# Patient Record
Sex: Female | Born: 1955 | Race: Black or African American | Hispanic: No | Marital: Single | Smoking: Current every day smoker
Health system: Southern US, Community
[De-identification: ages and names within clinical notes are randomized; demographics above are authoritative.]

## PROBLEM LIST (undated history)

## (undated) DIAGNOSIS — I10 Essential (primary) hypertension: Secondary | ICD-10-CM

## (undated) DIAGNOSIS — M109 Gout, unspecified: Secondary | ICD-10-CM

## (undated) DIAGNOSIS — E039 Hypothyroidism, unspecified: Secondary | ICD-10-CM

## (undated) DIAGNOSIS — R7303 Prediabetes: Secondary | ICD-10-CM

## (undated) DIAGNOSIS — E785 Hyperlipidemia, unspecified: Secondary | ICD-10-CM

## (undated) DIAGNOSIS — E559 Vitamin D deficiency, unspecified: Secondary | ICD-10-CM

## (undated) DIAGNOSIS — R87810 Cervical high risk human papillomavirus (HPV) DNA test positive: Secondary | ICD-10-CM

## (undated) HISTORY — DX: Cervical high risk human papillomavirus (HPV) DNA test positive: R87.810

## (undated) HISTORY — PX: OTHER SURGICAL HISTORY: SHX169

## (undated) HISTORY — DX: Gout, unspecified: M10.9

---
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
## 2018-11-25 DIAGNOSIS — J111 Influenza due to unidentified influenza virus with other respiratory manifestations: Secondary | ICD-10-CM | POA: Diagnosis not present

## 2018-12-01 DIAGNOSIS — R0602 Shortness of breath: Secondary | ICD-10-CM | POA: Diagnosis not present

## 2019-01-12 DIAGNOSIS — H5213 Myopia, bilateral: Secondary | ICD-10-CM | POA: Diagnosis not present

## 2019-03-20 DIAGNOSIS — H52223 Regular astigmatism, bilateral: Secondary | ICD-10-CM | POA: Diagnosis not present

## 2019-03-20 DIAGNOSIS — H524 Presbyopia: Secondary | ICD-10-CM | POA: Diagnosis not present

## 2019-05-03 DIAGNOSIS — S8992XA Unspecified injury of left lower leg, initial encounter: Secondary | ICD-10-CM | POA: Diagnosis not present

## 2019-05-03 DIAGNOSIS — M25462 Effusion, left knee: Secondary | ICD-10-CM | POA: Diagnosis not present

## 2019-05-07 DIAGNOSIS — S83412A Sprain of medial collateral ligament of left knee, initial encounter: Secondary | ICD-10-CM | POA: Diagnosis not present

## 2019-05-20 ENCOUNTER — Other Ambulatory Visit (HOSPITAL_COMMUNITY): Payer: Self-pay | Admitting: Nurse Practitioner

## 2019-05-20 ENCOUNTER — Encounter: Payer: Self-pay | Admitting: Adult Health

## 2019-05-20 DIAGNOSIS — Z1231 Encounter for screening mammogram for malignant neoplasm of breast: Secondary | ICD-10-CM

## 2019-05-26 ENCOUNTER — Encounter (INDEPENDENT_AMBULATORY_CARE_PROVIDER_SITE_OTHER): Payer: Self-pay | Admitting: *Deleted

## 2019-05-29 DIAGNOSIS — M25562 Pain in left knee: Secondary | ICD-10-CM | POA: Diagnosis not present

## 2019-05-29 DIAGNOSIS — S83412A Sprain of medial collateral ligament of left knee, initial encounter: Secondary | ICD-10-CM | POA: Diagnosis not present

## 2019-06-04 DIAGNOSIS — I10 Essential (primary) hypertension: Secondary | ICD-10-CM | POA: Diagnosis not present

## 2019-06-05 DIAGNOSIS — M25462 Effusion, left knee: Secondary | ICD-10-CM | POA: Diagnosis not present

## 2019-06-05 DIAGNOSIS — M1712 Unilateral primary osteoarthritis, left knee: Secondary | ICD-10-CM | POA: Diagnosis not present

## 2019-06-05 DIAGNOSIS — M25562 Pain in left knee: Secondary | ICD-10-CM | POA: Diagnosis not present

## 2019-06-05 DIAGNOSIS — S83242A Other tear of medial meniscus, current injury, left knee, initial encounter: Secondary | ICD-10-CM | POA: Diagnosis not present

## 2019-06-05 DIAGNOSIS — S83412A Sprain of medial collateral ligament of left knee, initial encounter: Secondary | ICD-10-CM | POA: Diagnosis not present

## 2019-06-10 DIAGNOSIS — S83242A Other tear of medial meniscus, current injury, left knee, initial encounter: Secondary | ICD-10-CM | POA: Diagnosis not present

## 2019-06-10 DIAGNOSIS — S8002XD Contusion of left knee, subsequent encounter: Secondary | ICD-10-CM | POA: Diagnosis not present

## 2019-06-10 DIAGNOSIS — S83412A Sprain of medial collateral ligament of left knee, initial encounter: Secondary | ICD-10-CM | POA: Diagnosis not present

## 2019-06-17 ENCOUNTER — Ambulatory Visit (HOSPITAL_COMMUNITY)
Admission: RE | Admit: 2019-06-17 | Discharge: 2019-06-17 | Disposition: A | Discharge status: Home / Self Care | Payer: Medicaid Other | Source: Ambulatory Visit | Attending: Nurse Practitioner | Admitting: Nurse Practitioner

## 2019-06-17 ENCOUNTER — Other Ambulatory Visit: Payer: Self-pay

## 2019-06-17 DIAGNOSIS — M10071 Idiopathic gout, right ankle and foot: Secondary | ICD-10-CM | POA: Diagnosis not present

## 2019-06-17 DIAGNOSIS — M19071 Primary osteoarthritis, right ankle and foot: Secondary | ICD-10-CM | POA: Diagnosis not present

## 2019-06-17 DIAGNOSIS — Z1231 Encounter for screening mammogram for malignant neoplasm of breast: Secondary | ICD-10-CM | POA: Diagnosis not present

## 2019-06-30 ENCOUNTER — Telehealth (INDEPENDENT_AMBULATORY_CARE_PROVIDER_SITE_OTHER): Payer: Self-pay | Admitting: *Deleted

## 2019-06-30 NOTE — Telephone Encounter (Signed)
06/25/19 left message for patient to call to schedule TCS - PROFICIENT REFERRAL  Referring MD/PCP: Anastasio Champion   Procedure: tcs  Reason/Indication:  screening  Has patient had this procedure before?  no  If so, when, by whom and where?    Is there a family history of colon cancer?  no  Who?  What age when diagnosed?    Is patient diabetic?   no      Does patient have prosthetic heart valve or mechanical valve?  no  Do you have a pacemaker/defibrillator?  no  Has patient ever had endocarditis/atrial fibrillation? no  Does patient use oxygen? no  Has patient had joint replacement within last 12 months?  no  Is patient constipated or do they take laxatives? no  Does patient have a history of alcohol/drug use?  no  Is patient on blood thinner such as Coumadin, Plavix and/or Aspirin? no  Medications: nifedipine 90 mg daily, lisinopril 40 mg daily, levothyroxine 75 ,cg daily, atorvastatin 20 mg daily  Allergies: nkda  Pharmacy: [facility]  Medication Adjustment per Dr Charlena Cross, NP:   Procedure date & time:

## 2019-07-08 ENCOUNTER — Other Ambulatory Visit (INDEPENDENT_AMBULATORY_CARE_PROVIDER_SITE_OTHER): Payer: Self-pay | Admitting: *Deleted

## 2019-07-08 DIAGNOSIS — S83412D Sprain of medial collateral ligament of left knee, subsequent encounter: Secondary | ICD-10-CM | POA: Diagnosis not present

## 2019-07-08 DIAGNOSIS — S83222D Peripheral tear of medial meniscus, current injury, left knee, subsequent encounter: Secondary | ICD-10-CM | POA: Diagnosis not present

## 2019-07-08 DIAGNOSIS — Z1211 Encounter for screening for malignant neoplasm of colon: Secondary | ICD-10-CM

## 2019-07-12 ENCOUNTER — Other Ambulatory Visit (INDEPENDENT_AMBULATORY_CARE_PROVIDER_SITE_OTHER): Payer: Self-pay | Admitting: Nurse Practitioner

## 2019-07-27 ENCOUNTER — Ambulatory Visit (INDEPENDENT_AMBULATORY_CARE_PROVIDER_SITE_OTHER): Payer: Medicaid Other | Admitting: Internal Medicine

## 2019-07-27 ENCOUNTER — Other Ambulatory Visit: Payer: Self-pay

## 2019-07-27 ENCOUNTER — Encounter (INDEPENDENT_AMBULATORY_CARE_PROVIDER_SITE_OTHER): Payer: Self-pay | Admitting: Internal Medicine

## 2019-07-27 DIAGNOSIS — E039 Hypothyroidism, unspecified: Secondary | ICD-10-CM | POA: Diagnosis not present

## 2019-07-27 DIAGNOSIS — R7303 Prediabetes: Secondary | ICD-10-CM

## 2019-07-27 DIAGNOSIS — I10 Essential (primary) hypertension: Secondary | ICD-10-CM

## 2019-07-27 DIAGNOSIS — E785 Hyperlipidemia, unspecified: Secondary | ICD-10-CM | POA: Insufficient documentation

## 2019-07-27 DIAGNOSIS — E559 Vitamin D deficiency, unspecified: Secondary | ICD-10-CM | POA: Diagnosis not present

## 2019-07-27 DIAGNOSIS — E782 Mixed hyperlipidemia: Secondary | ICD-10-CM | POA: Diagnosis not present

## 2019-07-27 HISTORY — DX: Essential (primary) hypertension: I10

## 2019-07-27 HISTORY — DX: Hypothyroidism, unspecified: E03.9

## 2019-07-27 HISTORY — DX: Prediabetes: R73.03

## 2019-07-27 HISTORY — DX: Vitamin D deficiency, unspecified: E55.9

## 2019-07-27 HISTORY — DX: Hyperlipidemia, unspecified: E78.5

## 2019-07-27 NOTE — Progress Notes (Signed)
   Wellness Office Visit  Subjective:  Patient ID: Samantha Valenzuela, female    DOB: 1956/09/07  Age: 63 y.o. MRN: [id number]  CC: This lady comes in to follow-up regarding her hypertension, hyperlipidemia, hypothyroidism, prediabetes and vitamin D deficiency. HPI  When she was seen for the first time in July, she was not taking any of her chronic medications and blood work showed severely undertreated hypothyroidism, significant hyperlipidemia and vitamin D deficiency as well as prediabetes.  Since that time, she has been taking all her medications. She denies any chest pain, dyspnea or palpitations. She has not been taking any vitamin D3 supplementation and I do not think it was recommended as yet. Past Medical History:  Diagnosis Date  . Essential hypertension, benign 07/27/2019  . HLD (hyperlipidemia) 07/27/2019  . Hypothyroidism, adult 07/27/2019  . Prediabetes 07/27/2019  . Vitamin D deficiency disease 07/27/2019      History reviewed. No pertinent family history.  Social History   Social History Narrative   Single.6 kids live with her.Unemployed .     Current Meds  Medication Sig  . atorvastatin (LIPITOR) 20 MG tablet TAKE 1 TABLET BY MOUTH EVERY DAY IN THE EVENING  . levothyroxine (SYNTHROID) 75 MCG tablet TAKE 1 TABLET BY MOUTH EVERY DAY  . lisinopril (ZESTRIL) 40 MG tablet TAKE 1 TABLET BY MOUTH EVERY DAY  . NIFEdipine (ADALAT CC) 90 MG 24 hr tablet TAKE 1 TABLET BY MOUTH EVERY DAY       Objective:   Today's Vitals: BP (!) 160/80   Pulse 72   Ht 5\' 3"  (1.6 m)   Wt 136 lb 6.4 oz (61.9 kg)   BMI 24.16 kg/m  Vitals with BMI 07/27/2019  Height 5\' 3"   Weight 136 lbs 6 oz  BMI 123456  Systolic 0000000  Diastolic 80  Pulse 72     Physical Exam    She looks systemically well.  Systolic blood pressure slightly elevated again today.  We will need to continue to monitor this closely.   Assessment   1. Essential hypertension, benign   2. Hypothyroidism, adult    3. Mixed hyperlipidemia   4. Prediabetes   5. Vitamin D deficiency disease       Tests ordered Orders Placed This Encounter  Procedures  . COMPLETE METABOLIC PANEL WITH GFR  . T3, free  . T4  . TSH  . Lipid panel     Plan: 1. She will continue with all medications for her chronic conditions above. 2. Blood work is ordered as above. 3. I will have her follow-up with Judson Roch in about a month for close monitoring of her hypertension and I will let her know of her blood results in the meantime.     Doree Albee, MD

## 2019-07-27 NOTE — Patient Instructions (Signed)
TAKE VITAMIN D3 10,000 units/day

## 2019-07-28 ENCOUNTER — Encounter (INDEPENDENT_AMBULATORY_CARE_PROVIDER_SITE_OTHER): Payer: Self-pay | Admitting: Internal Medicine

## 2019-07-28 ENCOUNTER — Other Ambulatory Visit (INDEPENDENT_AMBULATORY_CARE_PROVIDER_SITE_OTHER): Payer: Self-pay | Admitting: Internal Medicine

## 2019-07-28 LAB — COMPLETE METABOLIC PANEL WITH GFR
AG Ratio: 1.5 (calc) (ref 1.0–2.5)
ALT: 11 U/L (ref 6–29)
AST: 17 U/L (ref 10–35)
Albumin: 4.8 g/dL (ref 3.6–5.1)
Alkaline phosphatase (APISO): 108 U/L (ref 37–153)
BUN: 14 mg/dL (ref 7–25)
CO2: 27 mmol/L (ref 20–32)
Calcium: 10.1 mg/dL (ref 8.6–10.4)
Chloride: 102 mmol/L (ref 98–110)
Creat: 0.7 mg/dL (ref 0.50–0.99)
GFR, Est African American: 107 mL/min/{1.73_m2} (ref >=60)
GFR, Est Non African American: 92 mL/min/{1.73_m2} (ref >=60)
Globulin: 3.3 g/dL (calc) (ref 1.9–3.7)
Glucose, Bld: 92 mg/dL (ref 65–99)
Potassium: 4.4 mmol/L (ref 3.5–5.3)
Sodium: 138 mmol/L (ref 135–146)
Total Bilirubin: 0.4 mg/dL (ref 0.2–1.2)
Total Protein: 8.1 g/dL (ref 6.1–8.1)

## 2019-07-28 LAB — LIPID PANEL
Cholesterol: 238 mg/dL — ABNORMAL HIGH (ref <200)
HDL: 48 mg/dL — ABNORMAL LOW (ref >=50)
LDL Cholesterol (Calc): 164 mg/dL (calc) — ABNORMAL HIGH
Non-HDL Cholesterol (Calc): 190 mg/dL (calc) — ABNORMAL HIGH (ref <130)
Total CHOL/HDL Ratio: 5 (calc) — ABNORMAL HIGH (ref <5.0)
Triglycerides: 128 mg/dL (ref <150)

## 2019-07-28 LAB — T4: T4, Total: 7.2 ug/dL (ref 5.1–11.9)

## 2019-07-28 LAB — T3, FREE: T3, Free: 2.4 pg/mL (ref 2.3–4.2)

## 2019-07-28 LAB — TSH: TSH: 14.24 mIU/L — ABNORMAL HIGH (ref 0.40–4.50)

## 2019-07-28 MED ORDER — LEVOTHYROXINE SODIUM 100 MCG PO TABS: 100.0000 ug | ORAL_TABLET | Freq: Every day | ORAL | 3 refills | Status: DC

## 2019-07-28 NOTE — Progress Notes (Signed)
Patient called. Give verbal instruction on STOP current dose of thyroid medication. To START new RX levothyroxine 100 mcg; 1 daily. New Rx is at her pharmacy to pick today.

## 2019-07-30 DIAGNOSIS — K625 Hemorrhage of anus and rectum: Secondary | ICD-10-CM | POA: Diagnosis not present

## 2019-07-30 DIAGNOSIS — R109 Unspecified abdominal pain: Secondary | ICD-10-CM | POA: Diagnosis not present

## 2019-07-30 DIAGNOSIS — D259 Leiomyoma of uterus, unspecified: Secondary | ICD-10-CM | POA: Diagnosis not present

## 2019-07-30 DIAGNOSIS — K529 Noninfective gastroenteritis and colitis, unspecified: Secondary | ICD-10-CM | POA: Diagnosis not present

## 2019-07-30 DIAGNOSIS — R1032 Left lower quadrant pain: Secondary | ICD-10-CM | POA: Diagnosis not present

## 2019-07-30 DIAGNOSIS — R1031 Right lower quadrant pain: Secondary | ICD-10-CM | POA: Diagnosis not present

## 2019-08-01 DIAGNOSIS — E039 Hypothyroidism, unspecified: Secondary | ICD-10-CM | POA: Diagnosis not present

## 2019-08-01 DIAGNOSIS — I10 Essential (primary) hypertension: Secondary | ICD-10-CM | POA: Diagnosis not present

## 2019-08-01 DIAGNOSIS — K529 Noninfective gastroenteritis and colitis, unspecified: Secondary | ICD-10-CM | POA: Diagnosis not present

## 2019-08-02 DIAGNOSIS — A09 Infectious gastroenteritis and colitis, unspecified: Secondary | ICD-10-CM | POA: Diagnosis not present

## 2019-08-07 ENCOUNTER — Telehealth: Payer: Self-pay | Admitting: Adult Health

## 2019-08-07 NOTE — Telephone Encounter (Signed)

## 2019-08-10 ENCOUNTER — Other Ambulatory Visit (HOSPITAL_COMMUNITY)
Admission: RE | Admit: 2019-08-10 | Discharge: 2019-08-10 | Disposition: A | Discharge status: Home / Self Care | Payer: Medicaid Other | Source: Ambulatory Visit | Attending: Adult Health | Admitting: Adult Health

## 2019-08-10 ENCOUNTER — Other Ambulatory Visit: Payer: Self-pay

## 2019-08-10 ENCOUNTER — Ambulatory Visit: Payer: Medicaid Other | Admitting: Adult Health

## 2019-08-10 ENCOUNTER — Encounter: Payer: Self-pay | Admitting: Adult Health

## 2019-08-10 VITALS — BP 112/74 | HR 78 | Ht 65.0 in | Wt 135.8 lb

## 2019-08-10 DIAGNOSIS — Z01419 Encounter for gynecological examination (general) (routine) without abnormal findings: Secondary | ICD-10-CM | POA: Diagnosis not present

## 2019-08-10 DIAGNOSIS — Z124 Encounter for screening for malignant neoplasm of cervix: Secondary | ICD-10-CM | POA: Insufficient documentation

## 2019-08-10 DIAGNOSIS — Z1212 Encounter for screening for malignant neoplasm of rectum: Secondary | ICD-10-CM | POA: Diagnosis not present

## 2019-08-10 DIAGNOSIS — Z1211 Encounter for screening for malignant neoplasm of colon: Secondary | ICD-10-CM

## 2019-08-10 DIAGNOSIS — B369 Superficial mycosis, unspecified: Secondary | ICD-10-CM | POA: Diagnosis not present

## 2019-08-10 LAB — HEMOCCULT GUIAC POC 1CARD (OFFICE): Fecal Occult Blood, POC: NEGATIVE

## 2019-08-10 MED ORDER — NYSTATIN-TRIAMCINOLONE 100000-0.1 UNIT/GM-% EX OINT: 1.0000 "application " | TOPICAL_OINTMENT | Freq: Two times a day (BID) | CUTANEOUS | 3 refills | Status: DC

## 2019-08-10 NOTE — Progress Notes (Signed)
Patient ID: Samantha Valenzuela, female   DOB: 14-Nov-1955, 63 y.o.   MRN: [id number] History of Present Illness: Samantha Valenzuela is a  63 year old black female, single, PM in for a pap smear and pelvic exam, she is a new pt. PCP is Jeralyn Ruths NP.   Current Medications, Allergies, Past Medical History, Past Surgical History, Family History and Social History were reviewed in [employer] record.     Review of Systems: Patient denies any headaches, hearing loss, fatigue, blurred vision, shortness of breath, chest pain, abdominal pain, problems with bowel movements, urination, or intercourse(not active). No joint pain or mood swings. Has itching in groin area. She was in hospital this year with bowel infection at [facility].    Physical Exam:BP 112/74 (BP Location: Right Arm, Patient Position: Sitting, Cuff Size: Normal)   Pulse 78   Ht 5\' 5"  (1.651 m)   Wt 135 lb 12.8 oz (61.6 kg)   BMI 22.60 kg/m  General:  Well developed, well nourished, no acute distress Skin:  Warm and dry Neck:  Midline trachea, normal thyroid, good ROM, no lymphadenopathy,no carotid bruits heard  Lungs; Clear to auscultation bilaterally Cardiovascular: Regular rate and rhythm Pelvic:  External genitalia is normal in appearance, does have some discoloration in creases of legs.  The vagina is normal in appearance. Urethra has no lesions or masses. The cervix is smooth, slightly deviated to the right, pap with GC/CHL and high risk HPV testing and 16/18 genotyping performed.  Uterus is felt to be normal size, shape, and contour.  No adnexal masses or tenderness noted.Bladder is non tender, no masses felt. Rectal: Good sphincter tone, no polyps, or hemorrhoids felt.  Hemoccult negative. Extremities/musculoskeletal:  No swelling or varicosities noted, no clubbing or cyanosis Psych:  No mood changes, alert and cooperative,seems happy Fall risk is low PHQ 2 score 0 Examination chaperoned by Rolena Infante  LPN  Impression and Plan: 1. Routine cervical smear   2. Encounter for gynecological examination with Papanicolaou smear of cervix -pap sent -physical with PCP -pap in 3 years if normal -mammogram yearly -colonoscopy per GI -try to decrease smoking  3. Encounter for colorectal cancer screening  4. Superficial fungus infection of skin -will rx mycolog Meds ordered this encounter  Medications  . nystatin-triamcinolone ointment (MYCOLOG)    Sig: Apply 1 application topically 2 (two) times daily.    Dispense:  30 g    Refill:  3    Order Specific Question:   Supervising Provider    Answer:   Tania Ade H [[id number]]

## 2019-08-17 ENCOUNTER — Telehealth: Payer: Self-pay | Admitting: Adult Health

## 2019-08-17 NOTE — Telephone Encounter (Signed)
[facility] and left message to separate mycolog to nystatin and triamcinolone same directions.

## 2019-08-25 DIAGNOSIS — Z139 Encounter for screening, unspecified: Secondary | ICD-10-CM | POA: Insufficient documentation

## 2019-08-25 NOTE — Progress Notes (Signed)
Subjective:  Patient ID: Samantha Valenzuela, female    DOB: 11-Aug-1956  Age: 63 y.o. MRN: [id number]  CC:  Chief Complaint  Patient presents with  . Gout  . Hypertension  . Hyperlipidemia  . Hypothyroidism  . other    vitamin d deficiency  . other    Preventative medicine      HPI   This patient presents today for a follow-up office visit.  Left toe pain: This patient presents today complaining of left toe pain that started a couple of nights ago.  She tells me she recently had a gout attack a few weeks ago and tells me her symptoms are quite similar to her last gout attack.  She is wondering what she can do to treat this.  Screenings/health maintenance: She was a new patient to this practice approximately 3 to 4 months ago.  At that time she was ordered multiple tests and screenings.  She did complete mammogram in August of this year and is recommended to rescreen in 1 year.  She also was referred to OB/GYN to undergo Pap smear for cervical cancer screening.  This was collected on August 10, 2019.  Per patient's OB/GYN if cytology returned normal she will be recommended to rescreen in 3 years.  I had also ordered ultrasound of upper left chest wall for evaluation of a mass, this was not completed.  She tells me the mass has not changed in any way since I last saw her.  I also recommended that she undergo colon cancer screening via colonoscopy.  She tells me she is scheduled to have colonoscopy completed in December of this year.  She also has a history of hypertension.  She is currently on lisinopril 40 mg and nifedipine 90 mg.  She does check her blood pressures at home.  She tells me they have been very labile, but she has been having headaches and some lightheadedness at times.  She does note that her blood pressures are more often high at home been low.  At our last office visit her blood pressure was approximately 160/80.  She does report taking her medications as prescribed but  does need refills.  She also has a history of hyperlipidemia and continues on atorvastatin 20 mg daily.  Last lipid panel was collected in September 2020 and showed total cholesterol 238, HDL 48, LDL 164, and triglycerides 128.  Last comprehensive metabolic panel was collected in September 2020 and showed normal AST and ALT.  She also has a history of hypothyroidism.  She continues on levothyroxine 100 mcg daily.  Last lipid panel showed TSH of 14.25.  After this level was collected her dose was increased to the 100 mcg daily.  She tells me she is tolerating this well.  She tells me her energy levels are pretty stable, and she actually does not feel very fatigued.  Vitamin D deficiency:  Last serum level was 14.  She tells me she is currently taking 10,000 international units of vitamin D3 daily.  Past Medical History:  Diagnosis Date  . Essential hypertension, benign 07/27/2019  . HLD (hyperlipidemia) 07/27/2019  . Hypothyroidism, adult 07/27/2019  . Prediabetes 07/27/2019  . Vitamin D deficiency disease 07/27/2019      Family History  Problem Relation Age of Onset  . Dementia Father   . Hypertension Father   . Kidney disease Mother   . Cancer Daughter        ovarian  . Lupus Daughter  Social History   Social History Narrative   Single.6 kids live with her.Unemployed .   Social History   Tobacco Use  . Smoking status: Current Every Day Smoker    Packs/day: 0.50    Years: 43.00    Pack years: 21.50    Types: Cigarettes  . Smokeless tobacco: Never Used  Substance Use Topics  . Alcohol use: Yes    Comment: occassionally     Current Meds  Medication Sig  . cholecalciferol (VITAMIN D3) 25 MCG (1000 UT) tablet Take 10,000 Units by mouth daily.  Marland Kitchen levothyroxine (SYNTHROID) 100 MCG tablet Take 1 tablet (100 mcg total) by mouth daily.  Marland Kitchen lisinopril (ZESTRIL) 40 MG tablet Take 1 tablet (40 mg total) by mouth daily.  Marland Kitchen NIFEdipine (ADALAT CC) 90 MG 24 hr tablet Take 1 tablet  (90 mg total) by mouth daily.  . [DISCONTINUED] atorvastatin (LIPITOR) 20 MG tablet TAKE 1 TABLET BY MOUTH EVERY DAY IN THE EVENING  . [DISCONTINUED] lisinopril (ZESTRIL) 40 MG tablet TAKE 1 TABLET BY MOUTH EVERY DAY  . [DISCONTINUED] NIFEdipine (ADALAT CC) 90 MG 24 hr tablet TAKE 1 TABLET BY MOUTH EVERY DAY (Patient taking differently: 90 mg. )    ROS:  Review of Systems  Constitutional: Negative for chills, fever and malaise/fatigue.  Eyes: Negative for blurred vision and double vision.  Respiratory: Negative for cough and shortness of breath.   Cardiovascular: Negative for chest pain and palpitations.  Neurological: Positive for headaches. Negative for dizziness.     Objective:   Today's Vitals: BP (!) 138/92   Pulse 89   Resp 12   Ht 5' 4.5" (1.638 m)   Wt 131 lb 12.8 oz (59.8 kg)   SpO2 96%   BMI 22.27 kg/m  Vitals with BMI 08/26/2019 08/10/2019 07/27/2019  Height 5' 4.5" 5\' 5"  5\' 3"   Weight 131 lbs 13 oz 135 lbs 13 oz 136 lbs 6 oz  BMI 22.28 [id number] 123456  Systolic 0000000 [id number] 0000000  Diastolic 92 74 80  Pulse 89 78 72     Physical Exam Vitals signs reviewed.  Constitutional:      General: She is not in acute distress.    Appearance: Normal appearance.  HENT:     Head: Normocephalic and atraumatic.  Neck:     Vascular: No carotid bruit.  Cardiovascular:     Rate and Rhythm: Normal rate and regular rhythm.     Pulses: Normal pulses.     Heart sounds: Normal heart sounds.  Pulmonary:     Effort: Pulmonary effort is normal.     Breath sounds: Normal breath sounds.  Chest:    Musculoskeletal:       Feet:  Feet:     Left foot:     Skin integrity: Erythema and warmth present.  Skin:    General: Skin is warm and dry.  Neurological:     General: No focal deficit present.     Mental Status: She is alert and oriented to person, place, and time.  Psychiatric:        Mood and Affect: Mood normal.        Behavior: Behavior normal.        Judgment: Judgment normal.           Assessment   1. Essential hypertension, benign   2. Encounter for screening   3. Hyperlipidemia, unspecified hyperlipidemia type   4. Hypothyroidism, adult   5. Vitamin D deficiency disease   6. Acute gout  involving toe of left foot, unspecified cause   7. Chest wall mass       Tests ordered Orders Placed This Encounter  Procedures  . [place] CHEST SOFT TISSUE  . Uric acid  . TSH  . T3, Free  . T4, Free  . Vitamin D, 25-hydroxy     Plan: Please see assessment and plan per problem list below.   Meds ordered this encounter  Medications  . colchicine 0.6 MG tablet    Sig: Take 2 tablets by mouth once. Take 1 tablet by mouth 1 hour later if you continue to have pain.    Dispense:  3 tablet    Refill:  1    Order Specific Question:   Supervising Provider    Answer:   Anastasio Champion, NIMISH C [id number]  . indomethacin (INDOCIN) 25 MG capsule    Sig: Take 1 capsule (25 mg total) by mouth 3 (three) times daily as needed.    Dispense:  30 capsule    Refill:  0    Order Specific Question:   Supervising Provider    Answer:   Hurshel Party C [id number]  . lisinopril (ZESTRIL) 40 MG tablet    Sig: Take 1 tablet (40 mg total) by mouth daily.    Dispense:  90 tablet    Refill:  1    Order Specific Question:   Supervising Provider    Answer:   Hurshel Party C [id number]  . NIFEdipine (ADALAT CC) 90 MG 24 hr tablet    Sig: Take 1 tablet (90 mg total) by mouth daily.    Dispense:  90 tablet    Refill:  1    Order Specific Question:   Supervising Provider    Answer:   Hurshel Party C [id number]  . atorvastatin (LIPITOR) 20 MG tablet    Sig: Take 1 tablet (20 mg total) by mouth every evening.    Dispense:  90 tablet    Refill:  1    Order Specific Question:   Supervising Provider    Answer:   Hurshel Party C [id number]  . metoprolol tartrate (LOPRESSOR) 25 MG tablet    Sig: Take 0.5 tablets (12.5 mg total) by mouth 2 (two) times daily.    Dispense:  180 tablet    Refill:  1     Order Specific Question:   Supervising Provider    Answer:   Doree Albee [id number]    Patient to follow-up in 2 months, but was encouraged to call the office with any questions or concerns prior to then.Marland Kitchen  Ailene Ards, NP

## 2019-08-26 ENCOUNTER — Telehealth (INDEPENDENT_AMBULATORY_CARE_PROVIDER_SITE_OTHER): Payer: Self-pay

## 2019-08-26 ENCOUNTER — Ambulatory Visit (INDEPENDENT_AMBULATORY_CARE_PROVIDER_SITE_OTHER): Payer: Medicaid Other | Admitting: Nurse Practitioner

## 2019-08-26 ENCOUNTER — Encounter (INDEPENDENT_AMBULATORY_CARE_PROVIDER_SITE_OTHER): Payer: Self-pay | Admitting: Nurse Practitioner

## 2019-08-26 ENCOUNTER — Other Ambulatory Visit: Payer: Self-pay

## 2019-08-26 VITALS — BP 138/92 | HR 89 | Resp 12 | Ht 64.5 in | Wt 131.8 lb

## 2019-08-26 DIAGNOSIS — R222 Localized swelling, mass and lump, trunk: Secondary | ICD-10-CM | POA: Diagnosis not present

## 2019-08-26 DIAGNOSIS — E039 Hypothyroidism, unspecified: Secondary | ICD-10-CM

## 2019-08-26 DIAGNOSIS — I10 Essential (primary) hypertension: Secondary | ICD-10-CM | POA: Diagnosis not present

## 2019-08-26 DIAGNOSIS — E785 Hyperlipidemia, unspecified: Secondary | ICD-10-CM | POA: Diagnosis not present

## 2019-08-26 DIAGNOSIS — M109 Gout, unspecified: Secondary | ICD-10-CM | POA: Diagnosis not present

## 2019-08-26 DIAGNOSIS — Z139 Encounter for screening, unspecified: Secondary | ICD-10-CM

## 2019-08-26 DIAGNOSIS — E559 Vitamin D deficiency, unspecified: Secondary | ICD-10-CM

## 2019-08-26 LAB — CYTOLOGY - PAP
Chlamydia: NEGATIVE
Comment: NEGATIVE
Comment: NEGATIVE
Comment: NEGATIVE
Comment: NORMAL
Diagnosis: NEGATIVE
HPV 16: NEGATIVE
HPV 18 / 45: POSITIVE — AB
High risk HPV: POSITIVE — AB
Neisseria Gonorrhea: NEGATIVE

## 2019-08-26 MED ORDER — ATORVASTATIN CALCIUM 20 MG PO TABS: 20.0000 mg | ORAL_TABLET | Freq: Every evening | ORAL | 1 refills | Status: DC

## 2019-08-26 MED ORDER — LISINOPRIL 40 MG PO TABS: 40.0000 mg | ORAL_TABLET | Freq: Every day | ORAL | 1 refills | Status: DC

## 2019-08-26 MED ORDER — INDOMETHACIN 25 MG PO CAPS: 25.0000 mg | ORAL_CAPSULE | Freq: Three times a day (TID) | ORAL | 0 refills | Status: DC | PRN

## 2019-08-26 MED ORDER — METOPROLOL TARTRATE 25 MG PO TABS: 12.5000 mg | ORAL_TABLET | Freq: Two times a day (BID) | ORAL | 1 refills | Status: DC

## 2019-08-26 MED ORDER — COLCHICINE 0.6 MG PO TABS: ORAL_TABLET | ORAL | 1 refills | Status: DC

## 2019-08-26 MED ORDER — NIFEDIPINE ER 90 MG PO TB24: 90.0000 mg | ORAL_TABLET | Freq: Every day | ORAL | 1 refills | Status: DC

## 2019-08-26 NOTE — Assessment & Plan Note (Signed)
She will continue on her current dose of her statin.  I did tell her that colchicine may interact with her statin and she may have some muscle pain, but if she has muscle pain that lasts longer than 2 days after taking the colchicine she needs to call this office.  She tells me she understands.  May need to consider increasing her dose of statin at future office visit.  I opted not to make a change to this medication today as she was prescribed multiple new medications today, but this may be something that needs to be done in the future.  Consider collecting lipid panel at next office visit.

## 2019-08-26 NOTE — Patient Instructions (Signed)
Thank you for choosing [facility] as your medical provider! If you have any questions or concerns regarding your health care, please do not hesitate to call our office.  Things that we talked about today: 1.  Gout: I have prescribed you colchicine to take today.  Take 2 tablets at one time, wait 1 hour and if you continue to see redness swelling and have pain take 1 more tablet.  I have also prescribed indomethacin that you can take 1 to 2 capsules as needed every 8 hours with food for pain.  Take this medicine for the shortest amount of time that she can tolerate it. 2.  Hypertension: Blood pressure is still slightly above goal so we added metoprolol 12.5 mg that you take by mouth twice a day.  Continue to monitor blood pressure closely at home.  If you see blood pressure less than 100/70 and you are feeling unwell (tired, short of breath, chest pain, etc.)  You may stop the medication, but call this office prior to stopping it. 3.  Hypothyroidism: I will check her blood work today.  Based on those results I may send a new higher dose of your thyroid medication, if not I will refill the current dose that you are on once I get the results back. 4.  Vitamin D deficiency: Continue on your vitamin D supplement.  I will check your blood level today to ensure that it is improved. 5.  Chest wall mass: I am going to reorder the ultrasound for this area.  If you do not hear from someone to schedule this within 2 weeks please call us to let us know.  Please follow-up as scheduled in 2 months. We look forward to seeing you again soon! Have a great Halloween!!  At [facility] we value your feedback. You may receive a survey about your visit today. Please share your experience as we strive to create trusting relationships with our patients to provide genuine, compassionate, quality care.  We appreciate your understanding and patience as we review any laboratory studies, imaging, and other  diagnostic tests that are ordered as we care for you. We do our best to address any and all results in a timely manner. If you do not hear about test results within 1 week, please do not hesitate to contact us. If we referred you to a specialist during your visit or ordered imaging testing, contact the office if you have not been contacted to be scheduled within 1 weeks.  We also encourage the use of MyChart, which contains your medical information for your review as well. If you are not enrolled in this feature, an access code is on this after visit summary for your convenience. Thank you for allowing [place] to be involved in your care.

## 2019-08-26 NOTE — Assessment & Plan Note (Signed)
I will collect thyroid panel today for further evaluation of her hypothyroidism.  She seems to be tolerating her current dose of levothyroxine well and feels well on it.  She does need a refill of this medication, and I will refill it and or change the dose based on her blood results.

## 2019-08-26 NOTE — Assessment & Plan Note (Signed)
I will add metoprolol 12.5 mg twice daily to help control her blood pressure.  I did discuss common side effects of this medication and that if she feels unwell while taking this to call this office and we may need to discontinue it.  May need to consider changing it to long-acting metoprolol so she only needs to take 1 tablet/day, but both myself and the patient decided to try the short acting first to see how she tolerates the medication.  She was encouraged to monitor her blood pressure closely at home and I recommended that she call this office if she notices her blood pressure running less than 100/70 and if she starts to experience negative side effects such as (lightheadedness, dizziness, fatigue).

## 2019-08-26 NOTE — Assessment & Plan Note (Signed)
She was encouraged to follow-up with gastroenterology for colonoscopy as scheduled.  She tells me she had her flu shot recently while she was hospitalized for colitis.  Mammogram was negative so she will be due to rescreen in 1 year.   If her Pap smear results come back normal she will be due for that again in 3 years.

## 2019-08-26 NOTE — Assessment & Plan Note (Signed)
I will collect serum vitamin D level today to see if this has improved since starting her vitamin D supplement.  Further recommendations may be made based on these results.

## 2019-08-27 ENCOUNTER — Telehealth (INDEPENDENT_AMBULATORY_CARE_PROVIDER_SITE_OTHER): Payer: Self-pay | Admitting: Nurse Practitioner

## 2019-08-27 LAB — TSH: TSH: 2.13 mIU/L (ref 0.40–4.50)

## 2019-08-27 LAB — T3, FREE: T3, Free: 3.1 pg/mL (ref 2.3–4.2)

## 2019-08-27 LAB — VITAMIN D 25 HYDROXY (VIT D DEFICIENCY, FRACTURES): Vit D, 25-Hydroxy: 44 ng/mL (ref 30–100)

## 2019-08-27 LAB — URIC ACID: Uric Acid, Serum: 4.2 mg/dL (ref 2.5–7.0)

## 2019-08-27 LAB — T4, FREE: Free T4: 1.3 ng/dL (ref 0.8–1.8)

## 2019-08-27 NOTE — Telephone Encounter (Signed)
I called this patient today to go over her blood work results.  I also asked her how her toe is doing.  She tells me her pain is much improved as compared to yesterday.  I did note that her uric acid level was normal, thus not sure if the pain was coming from a gout attack.  However her pain is better.  I did encourage her to call this office if the pain comes back.  She tells me she understands.  She will follow-up as scheduled.

## 2019-08-31 ENCOUNTER — Telehealth: Payer: Self-pay | Admitting: Adult Health

## 2019-08-31 NOTE — Telephone Encounter (Signed)
Pt aware that pap was negative for GC/CHL and malignancy but +HPV 18 will make colpo app with Dr Elonda Husky

## 2019-09-01 ENCOUNTER — Other Ambulatory Visit (INDEPENDENT_AMBULATORY_CARE_PROVIDER_SITE_OTHER): Payer: Self-pay | Admitting: *Deleted

## 2019-09-01 ENCOUNTER — Other Ambulatory Visit: Payer: Self-pay

## 2019-09-01 ENCOUNTER — Telehealth (INDEPENDENT_AMBULATORY_CARE_PROVIDER_SITE_OTHER): Payer: Self-pay | Admitting: *Deleted

## 2019-09-01 ENCOUNTER — Ambulatory Visit (HOSPITAL_COMMUNITY)
Admission: RE | Admit: 2019-09-01 | Discharge: 2019-09-01 | Disposition: A | Discharge status: Home / Self Care | Payer: Medicaid Other | Source: Ambulatory Visit | Attending: Nurse Practitioner | Admitting: Nurse Practitioner

## 2019-09-01 ENCOUNTER — Encounter (INDEPENDENT_AMBULATORY_CARE_PROVIDER_SITE_OTHER): Payer: Self-pay | Admitting: *Deleted

## 2019-09-01 DIAGNOSIS — D170 Benign lipomatous neoplasm of skin and subcutaneous tissue of head, face and neck: Secondary | ICD-10-CM | POA: Diagnosis not present

## 2019-09-01 DIAGNOSIS — R222 Localized swelling, mass and lump, trunk: Secondary | ICD-10-CM

## 2019-09-01 DIAGNOSIS — R221 Localized swelling, mass and lump, neck: Secondary | ICD-10-CM | POA: Diagnosis not present

## 2019-09-01 DIAGNOSIS — Z1211 Encounter for screening for malignant neoplasm of colon: Secondary | ICD-10-CM

## 2019-09-01 MED ORDER — SUPREP BOWEL PREP KIT 17.5-3.13-1.6 GM/177ML PO SOLN: 1.0000 | Freq: Once | ORAL | 0 refills | Status: AC

## 2019-09-01 NOTE — Telephone Encounter (Signed)
Patient needs suprep TCS sch'd 12/17

## 2019-09-03 NOTE — Telephone Encounter (Signed)
Will need auth possible; NPC will get auth #.

## 2019-09-04 ENCOUNTER — Telehealth: Payer: Self-pay | Admitting: Obstetrics & Gynecology

## 2019-09-04 NOTE — Telephone Encounter (Signed)

## 2019-09-07 ENCOUNTER — Encounter: Payer: Self-pay | Admitting: Obstetrics & Gynecology

## 2019-09-07 ENCOUNTER — Ambulatory Visit: Payer: Medicaid Other | Admitting: Obstetrics & Gynecology

## 2019-09-07 ENCOUNTER — Other Ambulatory Visit: Payer: Self-pay

## 2019-09-07 VITALS — BP 111/73 | HR 55 | Ht 65.0 in | Wt 133.0 lb

## 2019-09-07 DIAGNOSIS — R87810 Cervical high risk human papillomavirus (HPV) DNA test positive: Secondary | ICD-10-CM | POA: Diagnosis not present

## 2019-09-07 NOTE — Progress Notes (Signed)
Colposcopy Procedure Note:  Colposcopy Procedure Note  Indications:  10/20 Pap smear. Negative cytology, positive HPV 18 No other Pap for 10 years  Smoker:  Yes.   New sexual partner:  No.    History of abnormal Pap: no  Procedure Details  The risks and benefits of the procedure and Written informed consent obtained.  Speculum placed in vagina and excellent visualization of cervix achieved, cervix swabbed x 3 with acetic acid solution.  Findings: Cervix: no visible lesions, no mosaicism, no punctation and no abnormal vasculature; SCJ visualized 360 degrees without lesions and no biopsies taken. Vaginal inspection: vaginal colposcopy not performed. Vulvar colposcopy: vulvar colposcopy not performed.  Specimens: none  Complications: none.  Plan: Repeat HPV based testing in 1 year

## 2019-09-12 ENCOUNTER — Other Ambulatory Visit (INDEPENDENT_AMBULATORY_CARE_PROVIDER_SITE_OTHER): Payer: Self-pay | Admitting: Internal Medicine

## 2019-09-16 DIAGNOSIS — S83412D Sprain of medial collateral ligament of left knee, subsequent encounter: Secondary | ICD-10-CM | POA: Diagnosis not present

## 2019-09-17 ENCOUNTER — Telehealth (INDEPENDENT_AMBULATORY_CARE_PROVIDER_SITE_OTHER): Payer: Self-pay | Admitting: *Deleted

## 2019-09-17 ENCOUNTER — Other Ambulatory Visit: Payer: Self-pay

## 2019-09-17 ENCOUNTER — Ambulatory Visit (INDEPENDENT_AMBULATORY_CARE_PROVIDER_SITE_OTHER): Payer: Self-pay

## 2019-09-17 NOTE — Telephone Encounter (Signed)
Referring MD/PCP: gosrani   Procedure: tcs  Reason/Indication:  screening  Has patient had this procedure before?  no             If so, when, by whom and where?    Is there a family history of colon cancer?  no             Who?  What age when diagnosed?    Is patient diabetic?   no                                                  Does patient have prosthetic heart valve or mechanical valve?  no  Do you have a pacemaker/defibrillator?  no  Has patient ever had endocarditis/atrial fibrillation? no  Does patient use oxygen? no  Has patient had joint replacement within last 12 months?  no  Is patient constipated or do they take laxatives? no  Does patient have a history of alcohol/drug use?  no  Is patient on blood thinner such as Coumadin, Plavix and/or Aspirin? no  Medications: nifedipine 90 mg daily, lisinopril 40 mg daily, levothyroxine 75 ,cg daily, atorvastatin 20 mg daily  Allergies: nkda  [facility]  Medication Adjustment per Dr Laural Golden:    Procedure date & time: 10/15/19 at 830

## 2019-10-05 NOTE — Telephone Encounter (Signed)
Colonoscopy with conscious sedation 

## 2019-10-07 ENCOUNTER — Ambulatory Visit (INDEPENDENT_AMBULATORY_CARE_PROVIDER_SITE_OTHER): Payer: Medicaid Other | Admitting: Nurse Practitioner

## 2019-10-07 ENCOUNTER — Encounter (INDEPENDENT_AMBULATORY_CARE_PROVIDER_SITE_OTHER): Payer: Self-pay | Admitting: Nurse Practitioner

## 2019-10-07 ENCOUNTER — Other Ambulatory Visit: Payer: Self-pay

## 2019-10-07 VITALS — BP 108/74 | HR 65 | Temp 97.2°F | Resp 14 | Ht 63.0 in | Wt 136.4 lb

## 2019-10-07 DIAGNOSIS — I10 Essential (primary) hypertension: Secondary | ICD-10-CM

## 2019-10-07 DIAGNOSIS — E039 Hypothyroidism, unspecified: Secondary | ICD-10-CM

## 2019-10-07 DIAGNOSIS — G4700 Insomnia, unspecified: Secondary | ICD-10-CM | POA: Insufficient documentation

## 2019-10-07 DIAGNOSIS — E559 Vitamin D deficiency, unspecified: Secondary | ICD-10-CM | POA: Diagnosis not present

## 2019-10-07 DIAGNOSIS — E785 Hyperlipidemia, unspecified: Secondary | ICD-10-CM | POA: Diagnosis not present

## 2019-10-07 NOTE — Patient Instructions (Signed)
Thank you for choosing [facility] as your medical provider! If you have any questions or concerns regarding your health care, please do not hesitate to call our office.  1. Try over the counter melatonin 1-3mg  at night by mouth as needed for sleep.  2. Continue all medications as prescribed  Please follow-up as scheduled in 6 months. We look forward to seeing you again soon! Have a great holiday!!  At [facility] we value your feedback. You may receive a survey about your visit today. Please share your experience as we strive to create trusting relationships with our patients to provide genuine, compassionate, quality care.  We appreciate your understanding and patience as we review any laboratory studies, imaging, and other diagnostic tests that are ordered as we care for you. We do our best to address any and all results in a timely manner. If you do not hear about test results within 1 week, please do not hesitate to contact us. If we referred you to a specialist during your visit or ordered imaging testing, contact the office if you have not been contacted to be scheduled within 1 weeks.  We also encourage the use of MyChart, which contains your medical information for your review as well. If you are not enrolled in this feature, an access code is on this after visit summary for your convenience. Thank you for allowing [place] to be involved in your care.

## 2019-10-07 NOTE — Assessment & Plan Note (Signed)
She will continue on current supplement dose.  Consider collecting serum level at next office visit.

## 2019-10-07 NOTE — Assessment & Plan Note (Signed)
Blood pressure is much better controlled with the addition of metoprolol.  She will continue on all medications as prescribed.  Follow-up as scheduled.

## 2019-10-07 NOTE — Progress Notes (Signed)
Subjective:  Patient ID: Samantha Valenzuela, female    DOB: [birth year]  Age: 63 y.o. MRN: [id number]  CC:  Chief Complaint  Patient presents with  . Follow-up      HPI  This patient arrives today for office visit follow-up on her chronic conditions.  Chest lesion: She underwent ultrasound and it was noted that this is most likely a benign lipoma.  Screenings: She is scheduled for colon cancer screening later on this month.  She underwent Pap smear and results came back negative  Vitamin D deficiency: Last serum level was 44 which is an improvement.  She continues on her supplementation  Hyperlipidemia: Last LDL collected in July was 64 she continues on her Lipitor  Hypothyroidism: Last thyroid panel showed normal thyroid levels on her levothyroxine.  She tells me she continues on this medication  Hypertension: She continues on lisinopril and the [place].  We recently started her on metoprolol as well her last office visit.  She tells me she is experiencing some mild fatigue since starting this but it is tolerable side effect.  Insomnia: She does tell me that she has a hard time falling asleep at night.  She is wondering if there is something that she can take to treat this.  Past Medical History:  Diagnosis Date  . Essential hypertension, benign 07/27/2019  . Gout   . HLD (hyperlipidemia) 07/27/2019  . Hypothyroidism, adult 07/27/2019  . Prediabetes 07/27/2019  . Vitamin D deficiency disease 07/27/2019      Family History  Problem Relation Age of Onset  . Dementia Father   . Hypertension Father   . Kidney disease Mother   . Cancer Daughter        ovarian  . Lupus Daughter   . Hypertension Sister   . Hyperlipidemia Sister     Social History   Social History Narrative   Single.6 kids live with her.Unemployed .   Social History   Tobacco Use  . Smoking status: Current Every Day Smoker    Packs/day: 0.50    Years: 43.00    Pack years: 21.50    Types:  Cigarettes  . Smokeless tobacco: Never Used  Substance Use Topics  . Alcohol use: Yes    Comment: occassionally     No outpatient medications have been marked as taking for the 10/07/19 encounter (Office Visit) with Ailene Ards, NP.    ROS:  Review of Systems  Constitutional: Positive for malaise/fatigue. Negative for chills, fever and weight loss.  Eyes: Negative for blurred vision.  Respiratory: Positive for cough and shortness of breath. Negative for wheezing.   Cardiovascular: Negative for chest pain and palpitations.  Skin: Negative for rash.  Neurological: Negative for dizziness, sensory change, weakness and headaches.  Psychiatric/Behavioral: Negative for depression and suicidal ideas.       Difficulty falling asleep     Objective:   Today's Vitals: BP 108/74   Pulse 65   Temp (!) 97.2 F (36.2 C)   Resp 14   Ht 5\' 3"  (1.6 m)   Wt 136 lb 6.4 oz (61.9 kg)   SpO2 95%   BMI 24.16 kg/m  Vitals with BMI 10/07/2019 09/07/2019 08/26/2019  Height 5\' 3"  5\' 5"  5' 4.5"  Weight 136 lbs 6 oz 133 lbs 131 lbs 13 oz  BMI 24.17 [id number] [id number]  Systolic 123XX123 [id number]  Diastolic 74 73 92  Pulse 65 55 89     Physical Exam Vitals signs reviewed.  Constitutional:      General: She is not in acute distress.    Appearance: Normal appearance.  HENT:     Head: Normocephalic and atraumatic.  Neck:     Vascular: No carotid bruit.  Cardiovascular:     Rate and Rhythm: Normal rate and regular rhythm.     Pulses: Normal pulses.     Heart sounds: Normal heart sounds.  Pulmonary:     Effort: Pulmonary effort is normal.     Breath sounds: Normal breath sounds.  Skin:    General: Skin is warm and dry.  Neurological:     General: No focal deficit present.     Mental Status: She is alert and oriented to person, place, and time.  Psychiatric:        Mood and Affect: Mood normal.        Behavior: Behavior normal.        Judgment: Judgment normal.          Assessment   1.  Essential hypertension, benign   2. Hypothyroidism, adult   3. Hyperlipidemia, unspecified hyperlipidemia type   4. Vitamin D deficiency disease   5. Insomnia, unspecified type       Tests ordered No orders of the defined types were placed in this encounter.    Plan: Please see assessment and plan per problem list below.   No orders of the defined types were placed in this encounter.   Patient to follow-up in 6 months.  Ailene Ards, NP

## 2019-10-07 NOTE — Assessment & Plan Note (Signed)
She will continue on current medication regimen.  Consider collecting thyroid panel at next office visit.

## 2019-10-07 NOTE — Assessment & Plan Note (Signed)
She will continue on current medication regimen at this time.  Consider collecting lipid panel at next office visit.

## 2019-10-07 NOTE — Assessment & Plan Note (Signed)
I have recommended that she try over-the-counter melatonin 1 to 3 mg by mouth at night as needed for sleep.  I encouraged her to call this office if this does not adequately help her with sleep and we can consider adjusting her treatment regimen.  She tells me she understands.

## 2019-10-13 ENCOUNTER — Other Ambulatory Visit (HOSPITAL_COMMUNITY): Payer: Medicaid Other

## 2019-10-13 ENCOUNTER — Other Ambulatory Visit (HOSPITAL_COMMUNITY)
Admission: RE | Admit: 2019-10-13 | Discharge: 2019-10-13 | Disposition: A | Discharge status: Home / Self Care | Payer: Medicaid Other | Source: Ambulatory Visit | Attending: Internal Medicine | Admitting: Internal Medicine

## 2019-10-13 DIAGNOSIS — Z01812 Encounter for preprocedural laboratory examination: Secondary | ICD-10-CM | POA: Insufficient documentation

## 2019-10-13 DIAGNOSIS — Z20828 Contact with and (suspected) exposure to other viral communicable diseases: Secondary | ICD-10-CM | POA: Insufficient documentation

## 2019-10-13 LAB — SARS CORONAVIRUS 2 (TAT 6-24 HRS): SARS Coronavirus 2: NEGATIVE

## 2019-10-15 ENCOUNTER — Encounter (HOSPITAL_COMMUNITY): Payer: Self-pay | Admitting: Internal Medicine

## 2019-10-15 ENCOUNTER — Encounter (HOSPITAL_COMMUNITY)
Admission: RE | Disposition: A | Discharge status: Home / Self Care | Payer: Self-pay | Source: Home / Self Care | Attending: Internal Medicine

## 2019-10-15 ENCOUNTER — Other Ambulatory Visit: Payer: Self-pay

## 2019-10-15 ENCOUNTER — Ambulatory Visit (HOSPITAL_COMMUNITY)
Admission: RE | Admit: 2019-10-15 | Discharge: 2019-10-15 | Disposition: A | Discharge status: Home / Self Care | Payer: Medicaid Other | Attending: Internal Medicine | Admitting: Internal Medicine

## 2019-10-15 DIAGNOSIS — E559 Vitamin D deficiency, unspecified: Secondary | ICD-10-CM | POA: Insufficient documentation

## 2019-10-15 DIAGNOSIS — Z79890 Hormone replacement therapy (postmenopausal): Secondary | ICD-10-CM | POA: Diagnosis not present

## 2019-10-15 DIAGNOSIS — E785 Hyperlipidemia, unspecified: Secondary | ICD-10-CM | POA: Diagnosis not present

## 2019-10-15 DIAGNOSIS — Z1211 Encounter for screening for malignant neoplasm of colon: Secondary | ICD-10-CM | POA: Insufficient documentation

## 2019-10-15 DIAGNOSIS — K648 Other hemorrhoids: Secondary | ICD-10-CM | POA: Diagnosis not present

## 2019-10-15 DIAGNOSIS — D122 Benign neoplasm of ascending colon: Secondary | ICD-10-CM | POA: Insufficient documentation

## 2019-10-15 DIAGNOSIS — Z79899 Other long term (current) drug therapy: Secondary | ICD-10-CM | POA: Insufficient documentation

## 2019-10-15 DIAGNOSIS — K644 Residual hemorrhoidal skin tags: Secondary | ICD-10-CM | POA: Insufficient documentation

## 2019-10-15 DIAGNOSIS — F17210 Nicotine dependence, cigarettes, uncomplicated: Secondary | ICD-10-CM | POA: Insufficient documentation

## 2019-10-15 DIAGNOSIS — I10 Essential (primary) hypertension: Secondary | ICD-10-CM | POA: Diagnosis not present

## 2019-10-15 HISTORY — PX: COLONOSCOPY: SHX5424

## 2019-10-15 HISTORY — PX: POLYPECTOMY: SHX5525

## 2019-10-15 SURGERY — COLONOSCOPY
Anesthesia: Moderate Sedation

## 2019-10-15 MED ORDER — STERILE WATER FOR IRRIGATION IR SOLN
Status: DC | PRN
  Administered 2019-10-15: 09:00:00 1.5 mL

## 2019-10-15 MED ORDER — MIDAZOLAM HCL 5 MG/5ML IJ SOLN
INTRAMUSCULAR | Status: AC
  Filled 2019-10-15: qty 10

## 2019-10-15 MED ORDER — MIDAZOLAM HCL 5 MG/5ML IJ SOLN
INTRAMUSCULAR | Status: DC | PRN
  Administered 2019-10-15 (×2): 2 mg via INTRAVENOUS
  Administered 2019-10-15: 1 mg via INTRAVENOUS
  Administered 2019-10-15: 2 mg via INTRAVENOUS

## 2019-10-15 MED ORDER — SODIUM CHLORIDE 0.9 % IV SOLN
INTRAVENOUS | Status: DC
  Administered 2019-10-15: 1000 mL via INTRAVENOUS

## 2019-10-15 MED ORDER — MEPERIDINE HCL 50 MG/ML IJ SOLN
INTRAMUSCULAR | Status: AC
  Filled 2019-10-15: qty 1

## 2019-10-15 MED ORDER — MEPERIDINE HCL 50 MG/ML IJ SOLN
INTRAMUSCULAR | Status: DC | PRN
  Administered 2019-10-15 (×2): 25 mg via INTRAVENOUS

## 2019-10-15 NOTE — Discharge Instructions (Signed)
No aspirin or NSAIDs for 24 hours. Resume usual medications and diet as before. No driving for 24 hours. Physician will call with biopsy results.   Colonoscopy, Adult, Care After This sheet gives you information about how to care for yourself after your procedure. Your doctor may also give you more specific instructions. If you have problems or questions, call your doctor. What can I expect after the procedure? After the procedure, it is common to have:  A small amount of blood in your poop for 24 hours.  Some gas.  Mild cramping or bloating in your belly. Follow these instructions at home: General instructions  For the first 24 hours after the procedure: ? Do not drive or use machinery. ? Do not sign important documents. ? Do not drink alcohol. ? Do your daily activities more slowly than normal. ? Eat foods that are soft and easy to digest.  Take over-the-counter or prescription medicines only as told by your doctor. To help cramping and bloating:   Try walking around.  Put heat on your belly (abdomen) as told by your doctor. Use a heat source that your doctor recommends, such as a moist heat pack or a heating pad. ? Put a towel between your skin and the heat source. ? Leave the heat on for 20-30 minutes. ? Remove the heat if your skin turns bright red. This is especially important if you cannot feel pain, heat, or cold. You can get burned. Eating and drinking   Drink enough fluid to keep your pee (urine) clear or pale yellow.  Return to your normal diet as told by your doctor. Avoid heavy or fried foods that are hard to digest.  Avoid drinking alcohol for as long as told by your doctor. Contact a doctor if:  You have blood in your poop (stool) 2-3 days after the procedure. Get help right away if:  You have more than a small amount of blood in your poop.  You see large clumps of tissue (blood clots) in your poop.  Your belly is swollen.  You feel sick to your  stomach (nauseous).  You throw up (vomit).  You have a fever.  You have belly pain that gets worse, and medicine does not help your pain. Summary  After the procedure, it is common to have a small amount of blood in your poop. You may also have mild cramping and bloating in your belly.  For the first 24 hours after the procedure, do not drive or use machinery, do not sign important documents, and do not drink alcohol.  Get help right away if you have a lot of blood in your poop, feel sick to your stomach, have a fever, or have more belly pain. This information is not intended to replace advice given to you by your health care provider. Make sure you discuss any questions you have with your health care provider. Document Released: 11/17/2010 Document Revised: 08/15/2017 Document Reviewed: 07/09/2016 Elsevier Patient Education  2020 [employer].  Colon Polyps  Polyps are tissue growths inside the body. Polyps can grow in many places, including the large intestine (colon). A polyp may be a round bump or a mushroom-shaped growth. You could have one polyp or several. Most colon polyps are noncancerous (benign). However, some colon polyps can become cancerous over time. Finding and removing the polyps early can help prevent this. What are the causes? The exact cause of colon polyps is not known. What increases the risk? You are more likely  to develop this condition if you:  Have a family history of colon cancer or colon polyps.  Are older than 88 or older than 45 if you are African American.  Have inflammatory bowel disease, such as ulcerative colitis or Crohn's disease.  Have certain hereditary conditions, such as: ? Familial adenomatous polyposis. ? Lynch syndrome. ? Turcot syndrome. ? Peutz-Jeghers syndrome.  Are overweight.  Smoke cigarettes.  Do not get enough exercise.  Drink too much alcohol.  Eat a diet that is high in fat and red meat and low in fiber.  Had  childhood cancer that was treated with abdominal radiation. What are the signs or symptoms? Most polyps do not cause symptoms. If you have symptoms, they may include:  Blood coming from your rectum when having a bowel movement.  Blood in your stool. The stool may look dark red or black.  Abdominal pain.  A change in bowel habits, such as constipation or diarrhea. How is this diagnosed? This condition is diagnosed with a colonoscopy. This is a procedure in which a lighted, flexible scope is inserted into the anus and then passed into the colon to examine the area. Polyps are sometimes found when a colonoscopy is done as part of routine cancer screening tests. How is this treated? Treatment for this condition involves removing any polyps that are found. Most polyps can be removed during a colonoscopy. Those polyps will then be tested for cancer. Additional treatment may be needed depending on the results of testing. Follow these instructions at home: Lifestyle  Maintain a healthy weight, or lose weight if recommended by your health care provider.  Exercise every day or as told by your health care provider.  Do not use any products that contain nicotine or tobacco, such as cigarettes and e-cigarettes. If you need help quitting, ask your health care provider.  If you drink alcohol, limit how much you have: ? 0-1 drink a day for women. ? 0-2 drinks a day for men.  Be aware of how much alcohol is in your drink. In the [place] one drink equals one 12 oz bottle of beer (355 mL), one 5 oz glass of wine (148 mL), or one 1 oz shot of hard liquor (44 mL). Eating and drinking   Eat foods that are high in fiber, such as fruits, vegetables, and whole grains.  Eat foods that are high in calcium and vitamin D, such as milk, cheese, yogurt, eggs, liver, fish, and broccoli.  Limit foods that are high in fat, such as fried foods and desserts.  Limit the amount of red meat and processed meat you  eat, such as hot dogs, sausage, bacon, and lunch meats. General instructions  Keep all follow-up visits as told by your health care provider. This is important. ? This includes having regularly scheduled colonoscopies. ? Talk to your health care provider about when you need a colonoscopy. Contact a health care provider if:  You have new or worsening bleeding during a bowel movement.  You have new or increased blood in your stool.  You have a change in bowel habits.  You lose weight for no known reason. Summary  Polyps are tissue growths inside the body. Polyps can grow in many places, including the colon.  Most colon polyps are noncancerous (benign), but some can become cancerous over time.  This condition is diagnosed with a colonoscopy.  Treatment for this condition involves removing any polyps that are found. Most polyps can be removed during a colonoscopy.  This information is not intended to replace advice given to you by your health care provider. Make sure you discuss any questions you have with your health care provider. Document Released: 07/11/2004 Document Revised: 01/30/2018 Document Reviewed: 01/30/2018 Elsevier Patient Education  2020 [employer].  Hemorrhoids Hemorrhoids are swollen veins that may develop:  In the butt (rectum). These are called internal hemorrhoids.  Around the opening of the butt (anus). These are called external hemorrhoids. Hemorrhoids can cause pain, itching, or bleeding. Most of the time, they do not cause serious problems. They usually get better with diet changes, lifestyle changes, and other home treatments. What are the causes? This condition may be caused by:  Having trouble pooping (constipation).  Pushing hard (straining) to poop.  Watery poop (diarrhea).  Pregnancy.  Being very overweight (obese).  Sitting for long periods of time.  Heavy lifting or other activity that causes you to strain.  Anal sex.  Riding a bike  for a long period of time. What are the signs or symptoms? Symptoms of this condition include:  Pain.  Itching or soreness in the butt.  Bleeding from the butt.  Leaking poop.  Swelling in the area.  One or more lumps around the opening of your butt. How is this diagnosed? A doctor can often diagnose this condition by looking at the affected area. The doctor may also:  Do an exam that involves feeling the area with a gloved hand (digital rectal exam).  Examine the area inside your butt using a small tube (anoscope).  Order blood tests. This may be done if you have lost a lot of blood.  Have you get a test that involves looking inside the colon using a flexible tube with a camera on the end (sigmoidoscopy or colonoscopy). How is this treated? This condition can usually be treated at home. Your doctor may tell you to change what you eat, make lifestyle changes, or try home treatments. If these do not help, procedures can be done to remove the hemorrhoids or make them smaller. These may involve:  Placing rubber bands at the base of the hemorrhoids to cut off their blood supply.  Injecting medicine into the hemorrhoids to shrink them.  Shining a type of light energy onto the hemorrhoids to cause them to fall off.  Doing surgery to remove the hemorrhoids or cut off their blood supply. Follow these instructions at home: Eating and drinking   Eat foods that have a lot of fiber in them. These include whole grains, beans, nuts, fruits, and vegetables.  Ask your doctor about taking products that have added fiber (fibersupplements).  Reduce the amount of fat in your diet. You can do this by: ? Eating low-fat dairy products. ? Eating less red meat. ? Avoiding processed foods.  Drink enough fluid to keep your pee (urine) pale yellow. Managing pain and swelling   Take a warm-water bath (sitz bath) for 20 minutes to ease pain. Do this 3-4 times a day. You may do this in a bathtub  or using a portable sitz bath that fits over the toilet.  If told, put ice on the painful area. It may be helpful to use ice between your warm baths. ? Put ice in a plastic bag. ? Place a towel between your skin and the bag. ? Leave the ice on for 20 minutes, 2-3 times a day. General instructions  Take over-the-counter and prescription medicines only as told by your doctor. ? Medicated creams and medicines may  be used as told.  Exercise often. Ask your doctor how much and what kind of exercise is best for you.  Go to the bathroom when you have the urge to poop. Do not wait.  Avoid pushing too hard when you poop.  Keep your butt dry and clean. Use wet toilet paper or moist towelettes after pooping.  Do not sit on the toilet for a long time.  Keep all follow-up visits as told by your doctor. This is important. Contact a doctor if you:  Have pain and swelling that do not get better with treatment or medicine.  Have trouble pooping.  Cannot poop.  Have pain or swelling outside the area of the hemorrhoids. Get help right away if you have:  Bleeding that will not stop. Summary  Hemorrhoids are swollen veins in the butt or around the opening of the butt.  They can cause pain, itching, or bleeding.  Eat foods that have a lot of fiber in them. These include whole grains, beans, nuts, fruits, and vegetables.  Take a warm-water bath (sitz bath) for 20 minutes to ease pain. Do this 3-4 times a day. This information is not intended to replace advice given to you by your health care provider. Make sure you discuss any questions you have with your health care provider. Document Released: 07/24/2008 Document Revised: 10/23/2018 Document Reviewed: 03/06/2018 Elsevier Patient Education  2020 [employer].

## 2019-10-15 NOTE — H&P (Signed)
Samantha Valenzuela is an 63 y.o. female.   Chief Complaint: Patient is here for colonoscopy. HPI: Patient is 63 year old female who is here for screening colonoscopy.  She has been referred by her primary care physician Dr. Anastasio Champion.  She has never been screened before.  She says she is not having normal bowel movements.  About 3 months ago she was seen at [facility] in [facility] for rectal bleeding diarrhea and abdominal pain and felt to have colitis.  She was treated with antibiotic and is fully recovered. Family history is negative for CRC.  Past Medical History:  Diagnosis Date  . Essential hypertension, benign 07/27/2019  . Gout   . HLD (hyperlipidemia) 07/27/2019  . Hypothyroidism, adult 07/27/2019  . Prediabetes 07/27/2019  . Vitamin D deficiency disease 07/27/2019    Past Surgical History:  Procedure Laterality Date  . none      Family History  Problem Relation Age of Onset  . Dementia Father   . Hypertension Father   . Kidney disease Mother   . Cancer Daughter        ovarian  . Lupus Daughter   . Hypertension Sister   . Hyperlipidemia Sister    Social History:  reports that she has been smoking cigarettes. She has a 21.50 pack-year smoking history. She has never used smokeless tobacco. She reports current alcohol use. She reports previous drug use.  Allergies: No Known Allergies  Medications Prior to Admission  Medication Sig Dispense Refill  . atorvastatin (LIPITOR) 20 MG tablet Take 1 tablet (20 mg total) by mouth every evening. 90 tablet 1  . cholecalciferol (VITAMIN D3) 25 MCG (1000 UT) tablet Take 10,000 Units by mouth daily.    Marland Kitchen levothyroxine (SYNTHROID) 100 MCG tablet Take 1 tablet (100 mcg total) by mouth daily. 30 tablet 3  . lisinopril (ZESTRIL) 40 MG tablet Take 1 tablet (40 mg total) by mouth daily. 90 tablet 1  . metoprolol tartrate (LOPRESSOR) 25 MG tablet Take 0.5 tablets (12.5 mg total) by mouth 2 (two) times daily. 180 tablet 1  . NIFEdipine (ADALAT CC) 90  MG 24 hr tablet Take 1 tablet (90 mg total) by mouth daily. 90 tablet 1    No results found for this or any previous visit (from the past 48 hour(s)). No results found.  Review of Systems  Blood pressure 125/85, pulse 74, temperature 98 F (36.7 C), temperature source Oral, resp. rate 14, height 5\' 3"  (1.6 m), weight 61.2 kg, SpO2 100 %. Physical Exam  Constitutional:  Well-developed thin female in NAD.  HENT:  Mouth/Throat: Oropharynx is clear and moist.  Eyes: Conjunctivae are normal. No scleral icterus.  Neck: No thyromegaly present.  Cardiovascular: Normal rate, regular rhythm and normal heart sounds.  No murmur heard. Respiratory: Effort normal and breath sounds normal.  GI: Soft. She exhibits no distension and no mass. There is no abdominal tenderness.  Musculoskeletal:        General: No edema.  Lymphadenopathy:    She has no cervical adenopathy.  Neurological: She is alert.  Skin: Skin is warm and dry.     Assessment/Plan Average risk screening colonoscopy.  Hildred Laser, MD 10/15/2019, 8:33 AM

## 2019-10-15 NOTE — Op Note (Signed)
[facility] Patient Name: Samantha Valenzuela Procedure Date: 10/15/2019 8:23 AM MRN: [id number] Date of Birth: 08/23/1956 Attending MD: Hildred Laser , MD CSN: [id number] Age: 63 Admit Type: Outpatient Procedure:                Colonoscopy Indications:              Screening for colorectal malignant neoplasm Providers:                Hildred Laser, MD, Charlsie Quest. Theda Sers RN, RN, Nelma Rothman, Technician Referring MD:             Doree Albee, MD Medicines:                Meperidine 50 mg IV, Midazolam 7 mg IV Complications:            No immediate complications. Estimated Blood Loss:     Estimated blood loss was minimal. Procedure:                Pre-Anesthesia Assessment:                           - Prior to the procedure, a History and Physical                            was performed, and patient medications and                            allergies were reviewed. The patient's tolerance of                            previous anesthesia was also reviewed. The risks                            and benefits of the procedure and the sedation                            options and risks were discussed with the patient.                            All questions were answered, and informed consent                            was obtained. Prior Anticoagulants: The patient has                            taken no previous anticoagulant or antiplatelet                            agents. ASA Grade Assessment: II - A patient with                            mild systemic disease. After reviewing the risks  and benefits, the patient was deemed in                            satisfactory condition to undergo the procedure.                           After obtaining informed consent, the colonoscope                            was passed under direct vision. Throughout the                            procedure, the patient's blood pressure, pulse,  and                            oxygen saturations were monitored continuously. The                            [id number]-[id number] scope was introduced through                            the anus and advanced to the the cecum, identified                            by appendiceal orifice and ileocecal valve. The                            colonoscopy was performed without difficulty. The                            patient tolerated the procedure well. The quality                            of the bowel preparation was adequate. The                            ileocecal valve, appendiceal orifice, and rectum                            were photographed. Scope In: 8:45:04 AM Scope Out: 9:10:33 AM Scope Withdrawal Time: 0 hours 9 minutes 56 seconds  Total Procedure Duration: 0 hours 25 minutes 29 seconds  Findings:      The perianal and digital rectal examinations were normal.      A 6 mm polyp was found in the ascending colon. The polyp was removed       with a cold snare. Resection and retrieval were complete. The pathology       specimen was placed into Bottle Number 1.      Two polyps were found in the ascending colon. The polyps were small in       size. These were biopsied with a cold forceps for histology. The       pathology specimen was placed into Bottle Number 1.      The exam was otherwise normal throughout the examined colon.      External  and internal hemorrhoids were found during retroflexion. The       hemorrhoids were medium-sized. Impression:               - One 6 mm polyp in the ascending colon, removed                            with a cold snare. Resected and retrieved.                           - Two small polyps in the ascending colon. Biopsied.                           - External and internal hemorrhoids. Moderate Sedation:      Moderate (conscious) sedation was administered by the endoscopy nurse       and supervised by the endoscopist. The following parameters  were       monitored: oxygen saturation, heart rate, blood pressure, CO2       capnography and response to care. Total physician intraservice time was       28 minutes. Recommendation:           - Patient has a contact number available for                            emergencies. The signs and symptoms of potential                            delayed complications were discussed with the                            patient. Return to normal activities tomorrow.                            Written discharge instructions were provided to the                            patient.                           - Resume previous diet today.                           - Continue present medications.                           - No aspirin, ibuprofen, naproxen, or other                            non-steroidal anti-inflammatory drugs for 1 day.                           - Await pathology results.                           - Repeat colonoscopy is recommended. The  colonoscopy date will be determined after pathology                            results from today's exam become available for                            review. Procedure Code(s):        --- Professional ---                           [phone], Colonoscopy, flexible; with removal of                            tumor(s), polyp(s), or other lesion(s) by snare                            technique                           45380, 59, Colonoscopy, flexible; with biopsy,                            single or multiple                           99153, Moderate sedation; each additional 15                            minutes intraservice time                           G0500, Moderate sedation services provided by the                            same physician or other qualified health care                            professional performing a gastrointestinal                            endoscopic service that sedation supports,                             requiring the presence of an independent trained                            observer to assist in the monitoring of the                            patient's level of consciousness and physiological                            status; initial 15 minutes of intra-service time;                            patient age 66 years or older (additional  time may                            be reported with [phone], as appropriate) Diagnosis Code(s):        --- Professional ---                           K63.5, Polyp of colon                           Z12.11, Encounter for screening for malignant                            neoplasm of colon                           K64.8, Other hemorrhoids CPT copyright 2019 [employer]. All rights reserved. The codes documented in this report are preliminary and upon coder review may  be revised to meet current compliance requirements. Hildred Laser, MD Hildred Laser, MD 10/15/2019 9:21:34 AM This report has been signed electronically. Number of Addenda: 0

## 2019-10-16 LAB — SURGICAL PATHOLOGY

## 2019-11-12 ENCOUNTER — Telehealth: Payer: Self-pay | Admitting: *Deleted

## 2019-11-12 NOTE — Telephone Encounter (Signed)
Opened in error

## 2019-12-02 ENCOUNTER — Encounter (INDEPENDENT_AMBULATORY_CARE_PROVIDER_SITE_OTHER): Payer: Self-pay | Admitting: Nurse Practitioner

## 2019-12-02 DIAGNOSIS — Z139 Encounter for screening, unspecified: Secondary | ICD-10-CM

## 2019-12-02 DIAGNOSIS — E785 Hyperlipidemia, unspecified: Secondary | ICD-10-CM

## 2019-12-02 DIAGNOSIS — E559 Vitamin D deficiency, unspecified: Secondary | ICD-10-CM

## 2019-12-02 DIAGNOSIS — I10 Essential (primary) hypertension: Secondary | ICD-10-CM

## 2019-12-02 DIAGNOSIS — M109 Gout, unspecified: Secondary | ICD-10-CM

## 2019-12-02 DIAGNOSIS — E039 Hypothyroidism, unspecified: Secondary | ICD-10-CM

## 2019-12-02 MED ORDER — NIFEDIPINE ER 90 MG PO TB24: 90.0000 mg | ORAL_TABLET | Freq: Every day | ORAL | 1 refills | Status: DC

## 2019-12-02 MED ORDER — LISINOPRIL 40 MG PO TABS: 40.0000 mg | ORAL_TABLET | Freq: Every day | ORAL | 1 refills | Status: DC

## 2019-12-02 MED ORDER — LEVOTHYROXINE SODIUM 100 MCG PO TABS: 100.0000 ug | ORAL_TABLET | Freq: Every day | ORAL | 3 refills | Status: DC

## 2019-12-02 MED ORDER — ATORVASTATIN CALCIUM 20 MG PO TABS: 20.0000 mg | ORAL_TABLET | Freq: Every evening | ORAL | 1 refills | Status: DC

## 2019-12-02 MED ORDER — METOPROLOL TARTRATE 25 MG PO TABS: 12.5000 mg | ORAL_TABLET | Freq: Two times a day (BID) | ORAL | 1 refills | Status: DC

## 2020-01-29 ENCOUNTER — Encounter (INDEPENDENT_AMBULATORY_CARE_PROVIDER_SITE_OTHER): Payer: Self-pay | Admitting: Nurse Practitioner

## 2020-04-04 ENCOUNTER — Ambulatory Visit (INDEPENDENT_AMBULATORY_CARE_PROVIDER_SITE_OTHER): Payer: Medicaid Other | Admitting: Nurse Practitioner

## 2020-04-06 ENCOUNTER — Ambulatory Visit (INDEPENDENT_AMBULATORY_CARE_PROVIDER_SITE_OTHER): Payer: Medicaid Other | Admitting: Internal Medicine

## 2020-04-06 ENCOUNTER — Encounter (INDEPENDENT_AMBULATORY_CARE_PROVIDER_SITE_OTHER): Payer: Self-pay | Admitting: Internal Medicine

## 2020-04-06 ENCOUNTER — Other Ambulatory Visit: Payer: Self-pay

## 2020-04-06 VITALS — BP 150/110 | HR 78 | Temp 96.9°F | Ht 63.0 in | Wt 132.6 lb

## 2020-04-06 DIAGNOSIS — E039 Hypothyroidism, unspecified: Secondary | ICD-10-CM | POA: Diagnosis not present

## 2020-04-06 DIAGNOSIS — I10 Essential (primary) hypertension: Secondary | ICD-10-CM | POA: Diagnosis not present

## 2020-04-06 DIAGNOSIS — E785 Hyperlipidemia, unspecified: Secondary | ICD-10-CM

## 2020-04-06 MED ORDER — LEVOTHYROXINE SODIUM 100 MCG PO TABS: 100.0000 ug | ORAL_TABLET | Freq: Every day | ORAL | 1 refills | Status: DC

## 2020-04-06 NOTE — Progress Notes (Signed)
Metrics: Intervention Frequency ACO  Documented Smoking Status Yearly  Screened one or more times in 24 months  Cessation Counseling or  Active cessation medication Past 24 months  Past 24 months   Guideline developer: UpToDate (See UpToDate for funding source) Date Released: 2014       Wellness Office Visit  Subjective:  Patient ID: Samantha Valenzuela, female    DOB: 11/23/1955  Age: 64 y.o. MRN: [id number]  CC: This lady comes in after a long hiatus to follow-up on her hypertension, hypothyroidism, hyperlipidemia. HPI  She says she is doing well and has no adverse symptoms.  She denies any chest pain, dyspnea, palpitations or limb weakness. She is compliant with her lisinopril, metoprolol and nifedipine for hypertension. She is compliant with atorvastatin for hyperlipidemia. She has run out of her levothyroxine for her hypothyroidism. Past Medical History:  Diagnosis Date  . Essential hypertension, benign 07/27/2019  . Gout   . HLD (hyperlipidemia) 07/27/2019  . Hypothyroidism, adult 07/27/2019  . Prediabetes 07/27/2019  . Vitamin D deficiency disease 07/27/2019   Past Surgical History:  Procedure Laterality Date  . COLONOSCOPY N/A 10/15/2019   Procedure: COLONOSCOPY;  Surgeon: Rogene Houston, MD;  [facility];  Service: Endoscopy;  Laterality: N/A;  830  . none    . POLYPECTOMY  10/15/2019   Procedure: POLYPECTOMY;  Surgeon: Rogene Houston, MD;  Location: [facility];  Service: Endoscopy;;   ascending colon     Family History  Problem Relation Age of Onset  . Dementia Father   . Hypertension Father   . Kidney disease Mother   . Cancer Daughter        ovarian  . Lupus Daughter   . Hypertension Sister   . Hyperlipidemia Sister     Social History   Social History Narrative   Single.6 kids live with her.Unemployed .   Social History   Tobacco Use  . Smoking status: Current Every Day Smoker    Packs/day: 0.50    Years: 43.00    Pack years: 21.50    Types: Cigarettes  . Smokeless tobacco: Never Used  Substance Use Topics  . Alcohol use: Yes    Comment: occassionally    Current Meds  Medication Sig  . atorvastatin (LIPITOR) 20 MG tablet Take 1 tablet (20 mg total) by mouth every evening.  . cholecalciferol (VITAMIN D3) 25 MCG (1000 UT) tablet Take 10,000 Units by mouth daily.  Marland Kitchen levothyroxine (SYNTHROID) 100 MCG tablet Take 1 tablet (100 mcg total) by mouth daily.  Marland Kitchen lisinopril (ZESTRIL) 40 MG tablet Take 1 tablet (40 mg total) by mouth daily.  . metoprolol tartrate (LOPRESSOR) 25 MG tablet Take 0.5 tablets (12.5 mg total) by mouth 2 (two) times daily.  Marland Kitchen NIFEdipine (ADALAT CC) 90 MG 24 hr tablet Take 1 tablet (90 mg total) by mouth daily.  . [DISCONTINUED] levothyroxine (SYNTHROID) 100 MCG tablet Take 1 tablet (100 mcg total) by mouth daily.      Depression screen PHQ 2/9 08/10/2019  Decreased Interest 0  Down, Depressed, Hopeless 0  PHQ - 2 Score 0     Objective:   Today's Vitals: BP (!) 150/110 (BP Location: Right Arm, Patient Position: Sitting, Cuff Size: Normal)   Pulse 78   Temp (!) 96.9 F (36.1 C) (Temporal)   Ht 5\' 3"  (1.6 m)   Wt 132 lb 9.6 oz (60.1 kg)   SpO2 96%   BMI 23.49 kg/m  Vitals with BMI 04/06/2020 10/15/2019 10/15/2019  Height 5\' 3"  - -  Weight 132 lbs 10 oz - -  BMI 32.76 - -  Systolic 147 92 94  Diastolic 092 72 72  Pulse 78 77 76     Physical Exam Her blood pressure was elevated today.  She feels well.  She is alert and orientated without any focal neurological signs.      Assessment   1. Essential hypertension, benign   2. Hyperlipidemia, unspecified hyperlipidemia type   3. Hypothyroidism, adult       Tests ordered No orders of the defined types were placed in this encounter.    Plan: 1. I have refilled medication for her hypothyroidism.  She will continue with this for the present time. 2. She will continue with statin therapy for hyperlipidemia with  atorvastatin. 3. She will continue with all her antihypertensive medications listed above but I am concerned that her blood pressure is not well controlled.  I will see her in close follow-up in about a month's time to reassess and we she will do blood work at that time.   Meds ordered this encounter  Medications  . levothyroxine (SYNTHROID) 100 MCG tablet    Sig: Take 1 tablet (100 mcg total) by mouth daily.    Dispense:  90 tablet    Refill:  1    Cailyn Houdek Luther Parody, MD

## 2020-05-05 ENCOUNTER — Encounter (INDEPENDENT_AMBULATORY_CARE_PROVIDER_SITE_OTHER): Payer: Self-pay | Admitting: Internal Medicine

## 2020-05-05 ENCOUNTER — Ambulatory Visit (INDEPENDENT_AMBULATORY_CARE_PROVIDER_SITE_OTHER): Payer: Medicaid Other | Admitting: Internal Medicine

## 2020-05-05 ENCOUNTER — Other Ambulatory Visit: Payer: Self-pay

## 2020-05-05 VITALS — BP 144/90 | Temp 97.6°F | Resp 18 | Ht 66.0 in | Wt 135.0 lb

## 2020-05-05 DIAGNOSIS — E039 Hypothyroidism, unspecified: Secondary | ICD-10-CM

## 2020-05-05 DIAGNOSIS — E785 Hyperlipidemia, unspecified: Secondary | ICD-10-CM

## 2020-05-05 DIAGNOSIS — I10 Essential (primary) hypertension: Secondary | ICD-10-CM | POA: Diagnosis not present

## 2020-05-05 DIAGNOSIS — R7303 Prediabetes: Secondary | ICD-10-CM

## 2020-05-05 NOTE — Progress Notes (Signed)
Metrics: Intervention Frequency ACO  Documented Smoking Status Yearly  Screened one or more times in 24 months  Cessation Counseling or  Active cessation medication Past 24 months  Past 24 months   Guideline developer: UpToDate (See UpToDate for funding source) Date Released: 2014       Wellness Office Visit  Subjective:  Patient ID: Samantha Valenzuela, female    DOB: Mar 14, 1956  Age: 64 y.o. MRN: [id number]  CC: This lady comes in for follow-up of hypertension, hypothyroidism and hyperlipidemia. HPI  On the last visit, I had reinstituted her levothyroxine and she has been taking it.  She has also been taking all her regular medications and is compliant with them for hypertension. She denies any chest pain, dyspnea, palpitations or limb weakness. She does have a history of prediabetes. Past Medical History:  Diagnosis Date  . Essential hypertension, benign 07/27/2019  . Gout   . HLD (hyperlipidemia) 07/27/2019  . Hypothyroidism, adult 07/27/2019  . Prediabetes 07/27/2019  . Vitamin D deficiency disease 07/27/2019   Past Surgical History:  Procedure Laterality Date  . COLONOSCOPY N/A 10/15/2019   Procedure: COLONOSCOPY;  Surgeon: Rogene Houston, MD;  [facility];  Service: Endoscopy;  Laterality: N/A;  830  . none    . POLYPECTOMY  10/15/2019   Procedure: POLYPECTOMY;  Surgeon: Rogene Houston, MD;  [facility];  Service: Endoscopy;;   ascending colon     Family History  Problem Relation Age of Onset  . Dementia Father   . Hypertension Father   . Kidney disease Mother   . Cancer Daughter        ovarian  . Lupus Daughter   . Hypertension Sister   . Hyperlipidemia Sister     Social History   Social History Narrative   Single.6 kids live with her.Unemployed .   Social History   Tobacco Use  . Smoking status: Current Every Day Smoker    Packs/day: 0.50    Years: 43.00    Pack years: 21.50    Types: Cigarettes  . Smokeless tobacco: Never Used   Substance Use Topics  . Alcohol use: Yes    Comment: occassionally    Current Meds  Medication Sig  . atorvastatin (LIPITOR) 20 MG tablet Take 1 tablet (20 mg total) by mouth every evening.  . cholecalciferol (VITAMIN D3) 25 MCG (1000 UT) tablet Take 10,000 Units by mouth daily.  Marland Kitchen levothyroxine (SYNTHROID) 100 MCG tablet Take 1 tablet (100 mcg total) by mouth daily.  Marland Kitchen lisinopril (ZESTRIL) 40 MG tablet Take 1 tablet (40 mg total) by mouth daily.  . metoprolol tartrate (LOPRESSOR) 25 MG tablet Take 0.5 tablets (12.5 mg total) by mouth 2 (two) times daily.  Marland Kitchen NIFEdipine (ADALAT CC) 90 MG 24 hr tablet Take 1 tablet (90 mg total) by mouth daily.       Depression screen PHQ 2/9 08/10/2019  Decreased Interest 0  Down, Depressed, Hopeless 0  PHQ - 2 Score 0     Objective:   Today's Vitals: BP (!) 144/90 (BP Location: Right Arm, Patient Position: Sitting, Cuff Size: Normal) Comment: just got off work; [employer]  Temp 97.6 F (36.4 C)   Resp 18   Ht 5\' 6"  (1.676 m)   Wt 135 lb (61.2 kg)   SpO2 98%   BMI 21.79 kg/m  Vitals with BMI 05/05/2020 04/06/2020 10/15/2019  Height 5\' 6"  5\' 3"  -  Weight 135 lbs 132 lbs 10 oz -  BMI 21.8  22.48 -  Systolic 250 037 92  Diastolic 90 048 72  Pulse - 78 77     Physical Exam   She looks systemically well.  Her blood pressure is improved from the last visit although it is still somewhat elevated.  Weight is stable.    Assessment   1. Hypothyroidism, adult   2. Essential hypertension, benign   3. Hyperlipidemia, unspecified hyperlipidemia type   4. Prediabetes       Tests ordered Orders Placed This Encounter  Procedures  . COMPLETE METABOLIC PANEL WITH GFR  . Hemoglobin A1c  . Lipid panel  . T3, free  . TSH     Plan: 1. She will continue with her levothyroxine for hypothyroidism and we will check a thyroid function test. 2. She will continue with lisinopril, metoprolol and nifedipine for hypertension.  Although her blood  pressure is somewhat elevated today, she has been rushing to get to the office and I will maintain the same doses for the time being.  If the blood pressure remains at this level next visit, we will have to adjust medications. 3. She will continue with atorvastatin for hyperlipidemia and I will check lipid panel today. 4. I will check an A1c with her previous history of prediabetes. 5. Further recommendations will depend on blood results and I will see her in about 2 months time for follow-up.   No orders of the defined types were placed in this encounter.   Doree Albee, MD

## 2020-05-06 LAB — HEMOGLOBIN A1C
Hgb A1c MFr Bld: 5.7 % of total Hgb — ABNORMAL HIGH (ref <5.7)
Mean Plasma Glucose: 117 (calc)
eAG (mmol/L): 6.5 (calc)

## 2020-05-06 LAB — COMPLETE METABOLIC PANEL WITH GFR
AG Ratio: 1.7 (calc) (ref 1.0–2.5)
ALT: 17 U/L (ref 6–29)
AST: 20 U/L (ref 10–35)
Albumin: 4.7 g/dL (ref 3.6–5.1)
Alkaline phosphatase (APISO): 85 U/L (ref 37–153)
BUN: 14 mg/dL (ref 7–25)
CO2: 27 mmol/L (ref 20–32)
Calcium: 10.2 mg/dL (ref 8.6–10.4)
Chloride: 102 mmol/L (ref 98–110)
Creat: 0.73 mg/dL (ref 0.50–0.99)
GFR, Est African American: 102 mL/min/{1.73_m2} (ref >=60)
GFR, Est Non African American: 88 mL/min/{1.73_m2} (ref >=60)
Globulin: 2.8 g/dL (calc) (ref 1.9–3.7)
Glucose, Bld: 91 mg/dL (ref 65–99)
Potassium: 4 mmol/L (ref 3.5–5.3)
Sodium: 137 mmol/L (ref 135–146)
Total Bilirubin: 0.6 mg/dL (ref 0.2–1.2)
Total Protein: 7.5 g/dL (ref 6.1–8.1)

## 2020-05-06 LAB — LIPID PANEL
Cholesterol: 168 mg/dL (ref <200)
HDL: 52 mg/dL (ref >=50)
LDL Cholesterol (Calc): 97 mg/dL (calc)
Non-HDL Cholesterol (Calc): 116 mg/dL (calc) (ref <130)
Total CHOL/HDL Ratio: 3.2 (calc) (ref <5.0)
Triglycerides: 99 mg/dL (ref <150)

## 2020-05-06 LAB — T3, FREE: T3, Free: 2.8 pg/mL (ref 2.3–4.2)

## 2020-05-06 LAB — TSH: TSH: 5.03 mIU/L — ABNORMAL HIGH (ref 0.40–4.50)

## 2020-05-09 ENCOUNTER — Other Ambulatory Visit (INDEPENDENT_AMBULATORY_CARE_PROVIDER_SITE_OTHER): Payer: Self-pay | Admitting: Internal Medicine

## 2020-05-09 MED ORDER — LEVOTHYROXINE SODIUM 125 MCG PO TABS: 125.0000 ug | ORAL_TABLET | Freq: Every day | ORAL | 3 refills | Status: DC

## 2020-05-09 NOTE — Progress Notes (Signed)
Please call this patient and let her know that we need to increase the thyroid medication further.  I have sent a new prescription of levothyroxine 125 mcg, take 1 daily.  I have sent this to the [facility] pharmacy in [place].  Follow-up as scheduled.

## 2020-05-09 NOTE — Progress Notes (Signed)
Return call to patient to give lab results. Pt was left voicemail of all the current resutls and medication change for the thyroid dose Increase.

## 2020-06-23 ENCOUNTER — Ambulatory Visit (INDEPENDENT_AMBULATORY_CARE_PROVIDER_SITE_OTHER): Payer: Medicaid Other | Admitting: Internal Medicine

## 2020-06-23 ENCOUNTER — Encounter (INDEPENDENT_AMBULATORY_CARE_PROVIDER_SITE_OTHER): Payer: Self-pay | Admitting: Internal Medicine

## 2020-06-23 ENCOUNTER — Other Ambulatory Visit: Payer: Self-pay

## 2020-06-23 VITALS — BP 120/70 | HR 75 | Temp 97.5°F | Resp 18 | Ht 65.0 in | Wt 128.6 lb

## 2020-06-23 DIAGNOSIS — E039 Hypothyroidism, unspecified: Secondary | ICD-10-CM | POA: Diagnosis not present

## 2020-06-23 NOTE — Progress Notes (Signed)
Metrics: Intervention Frequency ACO  Documented Smoking Status Yearly  Screened one or more times in 24 months  Cessation Counseling or  Active cessation medication Past 24 months  Past 24 months   Guideline developer: UpToDate (See UpToDate for funding source) Date Released: 2014       Wellness Office Visit  Subjective:  Patient ID: Samantha Valenzuela, female    DOB: [birth year]  Age: 64 y.o. MRN: [id number]  CC: This lady comes in for follow-up of hypothyroidism. HPI  I increased her levothyroxine on the last visit based on her thyroid function test.  She tells me that she does not feel any different with the higher dose but she does not feel badly.  She has tolerated the higher dose. Past Medical History:  Diagnosis Date  . Essential hypertension, benign 07/27/2019  . Gout   . HLD (hyperlipidemia) 07/27/2019  . Hypothyroidism, adult 07/27/2019  . Prediabetes 07/27/2019  . Vitamin D deficiency disease 07/27/2019   Past Surgical History:  Procedure Laterality Date  . COLONOSCOPY N/A 10/15/2019   Procedure: COLONOSCOPY;  Surgeon: Rogene Houston, MD;  [facility];  Service: Endoscopy;  Laterality: N/A;  830  . none    . POLYPECTOMY  10/15/2019   Procedure: POLYPECTOMY;  Surgeon: Rogene Houston, MD;  [facility];  Service: Endoscopy;;   ascending colon     Family History  Problem Relation Age of Onset  . Dementia Father   . Hypertension Father   . Kidney disease Mother   . Cancer Daughter        ovarian  . Lupus Daughter   . Hypertension Sister   . Hyperlipidemia Sister     Social History   Social History Narrative   Single.6 kids live with her.Unemployed .   Social History   Tobacco Use  . Smoking status: Current Every Day Smoker    Packs/day: 0.50    Years: 43.00    Pack years: 21.50    Types: Cigarettes  . Smokeless tobacco: Never Used  Substance Use Topics  . Alcohol use: Yes    Comment: occassionally    Current Meds  Medication  Sig  . atorvastatin (LIPITOR) 20 MG tablet Take 1 tablet (20 mg total) by mouth every evening.  . cholecalciferol (VITAMIN D3) 25 MCG (1000 UT) tablet Take 10,000 Units by mouth daily.  Marland Kitchen levothyroxine (SYNTHROID) 100 MCG tablet Take 1 tablet (100 mcg total) by mouth daily.  Marland Kitchen levothyroxine (SYNTHROID) 125 MCG tablet Take 1 tablet (125 mcg total) by mouth daily.  Marland Kitchen lisinopril (ZESTRIL) 40 MG tablet Take 1 tablet (40 mg total) by mouth daily.  . metoprolol tartrate (LOPRESSOR) 25 MG tablet Take 0.5 tablets (12.5 mg total) by mouth 2 (two) times daily.  Marland Kitchen NIFEdipine (ADALAT CC) 90 MG 24 hr tablet Take 1 tablet (90 mg total) by mouth daily.      Depression screen PHQ 2/9 08/10/2019  Decreased Interest 0  Down, Depressed, Hopeless 0  PHQ - 2 Score 0     Objective:   Today's Vitals: BP 120/70 (BP Location: Right Arm, Patient Position: Sitting, Cuff Size: Normal)   Pulse 75   Temp (!) 97.5 F (36.4 C) (Temporal)   Resp 18   Ht 5\' 5"  (1.651 m)   Wt 128 lb 9.6 oz (58.3 kg)   SpO2 98% Comment: wearing mask.  BMI 21.40 kg/m  Vitals with BMI 06/23/2020 05/05/2020 04/06/2020  Height 5\' 5"  5\' 6"  5\' 3"   Weight  128 lbs 10 oz 135 lbs 132 lbs 10 oz  BMI 21.4 47.6 54.65  Systolic 035 465 681  Diastolic 70 90 275  Pulse 75 - 78     Physical Exam  She looks systemically well and she has lost 7 pounds in weight since the last visit.  Blood pressure is better also.     Assessment   1. Hypothyroidism, adult       Tests ordered Orders Placed This Encounter  Procedures  . T3, free  . T4  . TSH     Plan: 1. She will continue with the same dose of levothyroxine and we will check thyroid function test today. 2. Further recommendations will depend on these results and I will have her follow-up with Sarah in about 3 months time.   No orders of the defined types were placed in this encounter.   Doree Albee, MD

## 2020-06-24 LAB — TSH: TSH: 3.85 mIU/L (ref 0.40–4.50)

## 2020-06-24 LAB — T4: T4, Total: 10 ug/dL (ref 5.1–11.9)

## 2020-06-24 LAB — T3, FREE: T3, Free: 3 pg/mL (ref 2.3–4.2)

## 2020-08-22 ENCOUNTER — Other Ambulatory Visit (HOSPITAL_COMMUNITY): Payer: Self-pay | Admitting: Nurse Practitioner

## 2020-08-22 DIAGNOSIS — Z1231 Encounter for screening mammogram for malignant neoplasm of breast: Secondary | ICD-10-CM

## 2020-09-14 ENCOUNTER — Ambulatory Visit (HOSPITAL_COMMUNITY): Payer: Medicaid Other

## 2020-09-16 ENCOUNTER — Ambulatory Visit (HOSPITAL_COMMUNITY)
Admission: RE | Admit: 2020-09-16 | Discharge: 2020-09-16 | Disposition: A | Discharge status: Home / Self Care | Payer: Medicaid Other | Source: Ambulatory Visit | Attending: Nurse Practitioner | Admitting: Nurse Practitioner

## 2020-09-16 ENCOUNTER — Other Ambulatory Visit: Payer: Self-pay

## 2020-09-16 DIAGNOSIS — Z1231 Encounter for screening mammogram for malignant neoplasm of breast: Secondary | ICD-10-CM

## 2020-09-20 ENCOUNTER — Other Ambulatory Visit (HOSPITAL_COMMUNITY): Payer: Self-pay | Admitting: Nurse Practitioner

## 2020-09-20 DIAGNOSIS — R928 Other abnormal and inconclusive findings on diagnostic imaging of breast: Secondary | ICD-10-CM

## 2020-09-27 ENCOUNTER — Other Ambulatory Visit: Payer: Self-pay

## 2020-09-27 ENCOUNTER — Ambulatory Visit (HOSPITAL_COMMUNITY)
Admission: RE | Admit: 2020-09-27 | Discharge: 2020-09-27 | Disposition: A | Discharge status: Home / Self Care | Payer: Medicaid Other | Source: Ambulatory Visit | Attending: Nurse Practitioner | Admitting: Nurse Practitioner

## 2020-09-27 DIAGNOSIS — R928 Other abnormal and inconclusive findings on diagnostic imaging of breast: Secondary | ICD-10-CM

## 2020-10-05 ENCOUNTER — Encounter (INDEPENDENT_AMBULATORY_CARE_PROVIDER_SITE_OTHER): Payer: Self-pay | Admitting: Nurse Practitioner

## 2020-10-05 ENCOUNTER — Ambulatory Visit (INDEPENDENT_AMBULATORY_CARE_PROVIDER_SITE_OTHER): Payer: Medicaid Other | Admitting: Nurse Practitioner

## 2020-10-05 VITALS — BP 132/78 | HR 73 | Temp 97.1°F | Ht 65.0 in | Wt 123.8 lb

## 2020-10-05 DIAGNOSIS — R634 Abnormal weight loss: Secondary | ICD-10-CM | POA: Diagnosis not present

## 2020-10-05 DIAGNOSIS — R0602 Shortness of breath: Secondary | ICD-10-CM | POA: Diagnosis not present

## 2020-10-05 DIAGNOSIS — R7303 Prediabetes: Secondary | ICD-10-CM

## 2020-10-05 DIAGNOSIS — E039 Hypothyroidism, unspecified: Secondary | ICD-10-CM

## 2020-10-05 DIAGNOSIS — E785 Hyperlipidemia, unspecified: Secondary | ICD-10-CM

## 2020-10-05 DIAGNOSIS — I10 Essential (primary) hypertension: Secondary | ICD-10-CM

## 2020-10-05 DIAGNOSIS — E559 Vitamin D deficiency, unspecified: Secondary | ICD-10-CM | POA: Diagnosis not present

## 2020-10-05 NOTE — Progress Notes (Signed)
Subjective:  Patient ID: Samantha Valenzuela, female    DOB: 08-25-1956  Age: 64 y.o. MRN: [id number]  CC:  Chief Complaint  Patient presents with  . Follow-up    having SOB with exertion, started after having covid, gets tired after walking  . Weight Loss    patient is loosing weight  . Hypothyroidism  . Hyperlipidemia  . Other    Vitamin D Def, Shortness of breath  . Hypertension      HPI  This patient arrives today for the above.  Unintentional weight loss: She does me she is been losing weight without meaning to.  Per chart review I do see that she is lost approximately 12 pounds over the last 5 months.  This is almost a 10% weight loss.  She is up-to-date with majority of her cancer screenings.  She is due for Pap smear screening anytime now.  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  She tells me generally she is feeling well she has not noticed any fevers, significant fatigue, blood in her stool, nausea, vomiting, abdominal pain, new/more severe headaches, etc.  She does mention that she does not eat much during the day.  She has been working a lot lately because her job is very short staffed.  Tells me she eats about 1 meal per day.  She does have a history of hypothyroidism as well.  Hypothyroidism: She is on 125 mcg levothyroxine daily.  She is due to have thyroid panel checked today.  Last TSH was 3.85.  Hyperlipidemia: She continues on Lipitor.  Last LDL was 97.  Vitamin D deficiency: She continues on her vitamin D3 supplement last serum level was 44.  Shortness of breath: She still experiences intermittent shortness of breath ever since she was diagnosed Covid.  She tells me she had Covid in October of this year.  Hypertension: She continues on her chronic antihypertensives and tells me she is tolerating these well.   Past Medical History:  Diagnosis Date  . Essential hypertension, benign  07/27/2019  . Gout   . HLD (hyperlipidemia) 07/27/2019  . Hypothyroidism, adult 07/27/2019  . Prediabetes 07/27/2019  . Vitamin D deficiency disease 07/27/2019      Family History  Problem Relation Age of Onset  . Dementia Father   . Hypertension Father   . Kidney disease Mother   . Cancer Daughter        ovarian  . Lupus Daughter   . Hypertension Sister   . Hyperlipidemia Sister     Social History   Social History Narrative   Single.6 kids live with her.Unemployed .   Social History   Tobacco Use  . Smoking status: Current Every Day Smoker    Packs/day: 0.50    Years: 43.00    Pack years: 21.50    Types: Cigarettes  . Smokeless tobacco: Never Used  Substance Use Topics  . Alcohol use: Yes    Comment: occassionally     Current Meds  Medication Sig  . atorvastatin (LIPITOR) 20 MG tablet Take 1 tablet (20 mg total) by mouth every evening.  . cholecalciferol (VITAMIN D3) 25 MCG (1000 UT) tablet Take 10,000 Units by mouth daily.  Marland Kitchen levothyroxine (SYNTHROID) 125 MCG tablet Take 1 tablet (125 mcg total) by mouth daily.  Marland Kitchen lisinopril (ZESTRIL) 40 MG tablet Take 1 tablet (40 mg total) by mouth daily.  . metoprolol tartrate (LOPRESSOR)  25 MG tablet Take 0.5 tablets (12.5 mg total) by mouth 2 (two) times daily.  Marland Kitchen NIFEdipine (ADALAT CC) 90 MG 24 hr tablet Take 1 tablet (90 mg total) by mouth daily.    ROS:  Review of Systems  Constitutional: Negative for fever and malaise/fatigue.  Respiratory: Positive for shortness of breath.   Cardiovascular: Negative for chest pain and palpitations.  Gastrointestinal: Negative for abdominal pain, blood in stool, melena and nausea.  Neurological: Positive for headaches (Will have headache for 2-3 days). Negative for dizziness.     Objective:   Today's Vitals: BP 132/78   Pulse 73   Temp (!) 97.1 F (36.2 C) (Temporal)   Ht $R'5\' 5"'kD$  (1.651 m)   Wt 123 lb 12.8 oz (56.2 kg)   SpO2 96%   BMI 20.60 kg/m  Vitals with BMI 10/05/2020  06/23/2020 05/05/2020  Height $Remov'5\' 5"'mxtiuO$  $Remove'5\' 5"'PIBYxVA$  $RemoveB'5\' 6"'BiTdIczL$   Weight 123 lbs 13 oz 128 lbs 10 oz 135 lbs  BMI 20.6 35.0 09.3  Systolic 818 299 371  Diastolic 78 70 90  Pulse 73 75 -     Physical Exam Vitals reviewed.  Constitutional:      General: She is not in acute distress.    Appearance: Normal appearance.  HENT:     Head: Normocephalic and atraumatic.  Neck:     Vascular: No carotid bruit.  Cardiovascular:     Rate and Rhythm: Normal rate and regular rhythm.     Pulses: Normal pulses.     Heart sounds: Normal heart sounds.  Pulmonary:     Effort: Pulmonary effort is normal.     Breath sounds: Normal breath sounds.  Skin:    General: Skin is warm and dry.  Neurological:     General: No focal deficit present.     Mental Status: She is alert and oriented to person, place, and time.  Psychiatric:        Mood and Affect: Mood normal.        Behavior: Behavior normal.        Judgment: Judgment normal.          Assessment and Plan   1. Unintentional weight loss   2. Shortness of breath   3. Essential hypertension, benign   4. Hypothyroidism, adult   5. Hyperlipidemia, unspecified hyperlipidemia type   6. Prediabetes   7. Vitamin D deficiency disease      Plan: 1.,  2.  We will collect blood work for further evaluation.  We will also send patient for chest x-ray.  Encouraged her to call her OB/GYN to have her Pap smear completed.  She was encouraged to make sure she eats at least 3 meals a day.  If chest x-ray is clear may consider sending for cardiac echocardiogram/referring her to post Covid clinic. 3.  Blood pressure acceptable today so she continue on her current medication regimen for hypertension. 4.  We will check thyroid panel today for further evaluation. 5.  She continue on her atorvastatin. 6.  We will check A1c today. 7.  We will check vitamin D level today.   Tests ordered Orders Placed This Encounter  Procedures  . DG Chest 2 View  . CMP with eGFR([facility])   . CBC with Differential/Platelets  . TSH  . Sedimentation Rate  . C-reactive Protein  . Hemoglobin A1c  . Hep C Antibody  . HIV antibody (with reflex)  . RPR  . T3, Free  . T4, Free  . Vitamin  D, 25-hydroxy  . Urinalysis with Culture Reflex      No orders of the defined types were placed in this encounter.   Patient to follow-up in 1 month for close monitoring of her weight, or sooner as needed.  Ailene Ards, NP

## 2020-10-05 NOTE — Patient Instructions (Addendum)
Please call [facility] to schedule your pap smear. Go to [facility] within the next week for chest xray.

## 2020-10-06 ENCOUNTER — Other Ambulatory Visit (INDEPENDENT_AMBULATORY_CARE_PROVIDER_SITE_OTHER): Payer: Self-pay | Admitting: Nurse Practitioner

## 2020-10-06 ENCOUNTER — Encounter (INDEPENDENT_AMBULATORY_CARE_PROVIDER_SITE_OTHER): Payer: Self-pay | Admitting: Nurse Practitioner

## 2020-10-06 DIAGNOSIS — E039 Hypothyroidism, unspecified: Secondary | ICD-10-CM

## 2020-10-06 LAB — COMPLETE METABOLIC PANEL WITH GFR
AG Ratio: 1.7 (calc) (ref 1.0–2.5)
ALT: 14 U/L (ref 6–29)
AST: 17 U/L (ref 10–35)
Albumin: 4.5 g/dL (ref 3.6–5.1)
Alkaline phosphatase (APISO): 74 U/L (ref 37–153)
BUN: 20 mg/dL (ref 7–25)
CO2: 28 mmol/L (ref 20–32)
Calcium: 9.9 mg/dL (ref 8.6–10.4)
Chloride: 103 mmol/L (ref 98–110)
Creat: 0.62 mg/dL (ref 0.50–0.99)
GFR, Est African American: 110 mL/min/{1.73_m2} (ref >=60)
GFR, Est Non African American: 95 mL/min/{1.73_m2} (ref >=60)
Globulin: 2.7 g/dL (calc) (ref 1.9–3.7)
Glucose, Bld: 100 mg/dL (ref 65–139)
Potassium: 3.8 mmol/L (ref 3.5–5.3)
Sodium: 139 mmol/L (ref 135–146)
Total Bilirubin: 0.4 mg/dL (ref 0.2–1.2)
Total Protein: 7.2 g/dL (ref 6.1–8.1)

## 2020-10-06 LAB — URINALYSIS W MICROSCOPIC + REFLEX CULTURE
Bacteria, UA: NONE SEEN /HPF
Bilirubin Urine: NEGATIVE
Glucose, UA: NEGATIVE
Hgb urine dipstick: NEGATIVE
Hyaline Cast: NONE SEEN /LPF
Ketones, ur: NEGATIVE
Leukocyte Esterase: NEGATIVE
Nitrites, Initial: NEGATIVE
Protein, ur: NEGATIVE
Specific Gravity, Urine: 1.014 (ref 1.001–1.03)
WBC, UA: NONE SEEN /HPF (ref 0–5)
pH: <=5 (ref 5.0–8.0)

## 2020-10-06 LAB — HEMOGLOBIN A1C
Hgb A1c MFr Bld: 5.8 % of total Hgb — ABNORMAL HIGH (ref <5.7)
Mean Plasma Glucose: 120 mg/dL
eAG (mmol/L): 6.6 mmol/L

## 2020-10-06 LAB — CBC WITH DIFFERENTIAL/PLATELET
Absolute Monocytes: 333 cells/uL (ref 200–950)
Basophils Absolute: 52 cells/uL (ref 0–200)
Basophils Relative: 0.7 %
Eosinophils Absolute: 703 cells/uL — ABNORMAL HIGH (ref 15–500)
Eosinophils Relative: 9.5 %
HCT: 35.4 % (ref 35.0–45.0)
Hemoglobin: 11.7 g/dL (ref 11.7–15.5)
Lymphs Abs: 4225 cells/uL — ABNORMAL HIGH (ref 850–3900)
MCH: 27.7 pg (ref 27.0–33.0)
MCHC: 33.1 g/dL (ref 32.0–36.0)
MCV: 83.9 fL (ref 80.0–100.0)
MPV: 9.7 fL (ref 7.5–12.5)
Monocytes Relative: 4.5 %
Neutro Abs: 2087 cells/uL (ref 1500–7800)
Neutrophils Relative %: 28.2 %
Platelets: 456 10*3/uL — ABNORMAL HIGH (ref 140–400)
RBC: 4.22 10*6/uL (ref 3.80–5.10)
RDW: 13.7 % (ref 11.0–15.0)
Total Lymphocyte: 57.1 %
WBC: 7.4 10*3/uL (ref 3.8–10.8)

## 2020-10-06 LAB — RPR: RPR Ser Ql: NONREACTIVE

## 2020-10-06 LAB — T3, FREE: T3, Free: 2.6 pg/mL (ref 2.3–4.2)

## 2020-10-06 LAB — C-REACTIVE PROTEIN: CRP: 0.5 mg/L (ref <8.0)

## 2020-10-06 LAB — NO CULTURE INDICATED

## 2020-10-06 LAB — T4, FREE: Free T4: 0.8 ng/dL (ref 0.8–1.8)

## 2020-10-06 LAB — HEPATITIS C ANTIBODY
Hepatitis C Ab: NONREACTIVE
SIGNAL TO CUT-OFF: 0.01 (ref <1.00)

## 2020-10-06 LAB — SEDIMENTATION RATE: Sed Rate: 19 mm/h (ref 0–30)

## 2020-10-06 LAB — HIV ANTIBODY (ROUTINE TESTING W REFLEX): HIV 1&2 Ab, 4th Generation: NONREACTIVE

## 2020-10-06 LAB — TSH: TSH: 5.18 mIU/L — ABNORMAL HIGH (ref 0.40–4.50)

## 2020-10-06 LAB — VITAMIN D 25 HYDROXY (VIT D DEFICIENCY, FRACTURES): Vit D, 25-Hydroxy: 36 ng/mL (ref 30–100)

## 2020-10-06 MED ORDER — LEVOTHYROXINE SODIUM 150 MCG PO TABS: 150.0000 ug | ORAL_TABLET | Freq: Every day | ORAL | 0 refills | Status: DC

## 2020-10-10 ENCOUNTER — Other Ambulatory Visit (INDEPENDENT_AMBULATORY_CARE_PROVIDER_SITE_OTHER): Payer: Self-pay | Admitting: Nurse Practitioner

## 2020-10-14 ENCOUNTER — Ambulatory Visit (HOSPITAL_COMMUNITY)
Admission: RE | Admit: 2020-10-14 | Discharge: 2020-10-14 | Disposition: A | Discharge status: Home / Self Care | Payer: Medicaid Other | Source: Ambulatory Visit | Attending: Nurse Practitioner | Admitting: Nurse Practitioner

## 2020-10-14 ENCOUNTER — Other Ambulatory Visit: Payer: Self-pay

## 2020-10-14 DIAGNOSIS — I10 Essential (primary) hypertension: Secondary | ICD-10-CM | POA: Diagnosis not present

## 2020-10-14 DIAGNOSIS — R0602 Shortness of breath: Secondary | ICD-10-CM | POA: Diagnosis not present

## 2020-10-14 DIAGNOSIS — E039 Hypothyroidism, unspecified: Secondary | ICD-10-CM | POA: Diagnosis not present

## 2020-10-14 DIAGNOSIS — E785 Hyperlipidemia, unspecified: Secondary | ICD-10-CM | POA: Diagnosis not present

## 2020-10-14 DIAGNOSIS — R634 Abnormal weight loss: Secondary | ICD-10-CM | POA: Diagnosis not present

## 2020-10-14 DIAGNOSIS — E559 Vitamin D deficiency, unspecified: Secondary | ICD-10-CM | POA: Diagnosis not present

## 2020-10-14 DIAGNOSIS — R7303 Prediabetes: Secondary | ICD-10-CM | POA: Insufficient documentation

## 2020-11-17 ENCOUNTER — Encounter (INDEPENDENT_AMBULATORY_CARE_PROVIDER_SITE_OTHER): Payer: Self-pay | Admitting: Nurse Practitioner

## 2020-11-17 ENCOUNTER — Ambulatory Visit (INDEPENDENT_AMBULATORY_CARE_PROVIDER_SITE_OTHER): Payer: Medicaid Other | Admitting: Nurse Practitioner

## 2020-11-17 ENCOUNTER — Other Ambulatory Visit: Payer: Self-pay

## 2020-11-17 VITALS — BP 122/82 | HR 66 | Temp 96.9°F | Ht 65.0 in | Wt 124.8 lb

## 2020-11-17 DIAGNOSIS — E039 Hypothyroidism, unspecified: Secondary | ICD-10-CM | POA: Diagnosis not present

## 2020-11-17 DIAGNOSIS — R634 Abnormal weight loss: Secondary | ICD-10-CM

## 2020-11-17 DIAGNOSIS — D75839 Thrombocytosis, unspecified: Secondary | ICD-10-CM

## 2020-11-17 NOTE — Patient Instructions (Signed)
If you are interested in going to the post-covid clinic you can call [phone] to schedule an appointment.

## 2020-11-17 NOTE — Progress Notes (Signed)
Subjective:  Patient ID: Samantha Valenzuela, female    DOB: [birth year]  Age: 65 y.o. MRN: [id number]  CC:  Chief Complaint  Patient presents with  . Follow-up    Patient is feeling good  . Hypothyroidism  . Other    Unintentional weight loss  . thrombocytosis      HPI  This patient arrives today for the above.  Hypothyroidism: She continues on levothyroxine 125 mcg daily.  Last TSH was 3.85.  Unintentional weight loss: She had mentioned some unintentional weight loss at last office visit.  We did discuss making sure that she is eating regular meals throughout the day and if she decides she is not hungry she should at least drink a meal replacement shake such as Ensure.  She tells me she has been trying to eat food throughout the day and will drink an Ensure sometimes as well.  She says she feels good and has no concerns today.  Thrombocytosis: Last blood work showed elevated platelet count on her CBC.  She is due to have this rechecked today.  Past Medical History:  Diagnosis Date  . Essential hypertension, benign 07/27/2019  . Gout   . HLD (hyperlipidemia) 07/27/2019  . Hypothyroidism, adult 07/27/2019  . Prediabetes 07/27/2019  . Vitamin D deficiency disease 07/27/2019      Family History  Problem Relation Age of Onset  . Dementia Father   . Hypertension Father   . Kidney disease Mother   . Cancer Daughter        ovarian  . Lupus Daughter   . Hypertension Sister   . Hyperlipidemia Sister     Social History   Social History Narrative   Single.6 kids live with her.Unemployed .   Social History   Tobacco Use  . Smoking status: Current Every Day Smoker    Packs/day: 0.50    Years: 43.00    Pack years: 21.50    Types: Cigarettes  . Smokeless tobacco: Never Used  Substance Use Topics  . Alcohol use: Yes    Comment: occassionally     Current Meds  Medication Sig  . atorvastatin (LIPITOR) 20 MG tablet Take 1 tablet (20 mg total) by mouth every evening.   . cholecalciferol (VITAMIN D3) 25 MCG (1000 UT) tablet Take 10,000 Units by mouth daily.  Marland Kitchen levothyroxine (SYNTHROID) 125 MCG tablet Take 125 mcg by mouth daily before breakfast.  . lisinopril (ZESTRIL) 40 MG tablet Take 1 tablet (40 mg total) by mouth daily.  . metoprolol tartrate (LOPRESSOR) 25 MG tablet Take 0.5 tablets (12.5 mg total) by mouth 2 (two) times daily.  Marland Kitchen NIFEdipine (ADALAT CC) 90 MG 24 hr tablet Take 1 tablet (90 mg total) by mouth daily.    ROS:  Review of Systems  Constitutional: Negative for fever.  Respiratory: Negative for shortness of breath.   Cardiovascular: Negative for chest pain.  Neurological: Negative for dizziness and headaches.     Objective:   Today's Vitals: BP 122/82   Pulse 66   Temp (!) 96.9 F (36.1 C) (Temporal)   Ht 5\' 5"  (1.651 m)   Wt 124 lb 12.8 oz (56.6 kg)   SpO2 97%   BMI 20.77 kg/m  Vitals with BMI 11/17/2020 10/05/2020 06/23/2020  Height 5\' 5"  5\' 5"  5\' 5"   Weight 124 lbs 13 oz 123 lbs 13 oz 128 lbs 10 oz  BMI 20.77 16.0 10.9  Systolic 323 557 322  Diastolic 82 78 70  Pulse 66 73  75     Physical Exam Vitals reviewed.  Constitutional:      General: She is not in acute distress.    Appearance: Normal appearance.  HENT:     Head: Normocephalic and atraumatic.  Neck:     Vascular: No carotid bruit.  Cardiovascular:     Rate and Rhythm: Normal rate and regular rhythm.     Pulses: Normal pulses.     Heart sounds: Normal heart sounds.  Pulmonary:     Effort: Pulmonary effort is normal.     Breath sounds: Normal breath sounds.  Skin:    General: Skin is warm and dry.  Neurological:     General: No focal deficit present.     Mental Status: She is alert and oriented to person, place, and time.  Psychiatric:        Mood and Affect: Mood normal.        Behavior: Behavior normal.        Judgment: Judgment normal.          Assessment and Plan   1. Hypothyroidism, adult   2. Thrombocytosis   3. Unintentional  weight loss      Plan: 1.  We will check TSH today for further evaluation. 2.  We will recheck CBC for further evaluation today.  May consider referral to hematology if CBC shows elevated platelet levels persist. 3.  Weight has stabilized and in fact has increased in 1 pound.  She was encouraged to continue trying to eat 3 meals per day and if she is not hungry for a meal she should at least drink a meal replacement shake such as Ensure.  She tells me she understands.   Tests ordered Orders Placed This Encounter  Procedures  . TSH  . CBC with Differential/Platelets      No orders of the defined types were placed in this encounter.   Patient to follow-up in 3 months or sooner as needed.  Ailene Ards, NP

## 2020-11-18 LAB — CBC WITH DIFFERENTIAL/PLATELET
Absolute Monocytes: 510 cells/uL (ref 200–950)
Basophils Absolute: 48 cells/uL (ref 0–200)
Basophils Relative: 0.7 %
Eosinophils Absolute: 578 cells/uL — ABNORMAL HIGH (ref 15–500)
Eosinophils Relative: 8.5 %
HCT: 35.5 % (ref 35.0–45.0)
Hemoglobin: 11.6 g/dL — ABNORMAL LOW (ref 11.7–15.5)
Lymphs Abs: 3978 cells/uL — ABNORMAL HIGH (ref 850–3900)
MCH: 28.2 pg (ref 27.0–33.0)
MCHC: 32.7 g/dL (ref 32.0–36.0)
MCV: 86.2 fL (ref 80.0–100.0)
MPV: 9.5 fL (ref 7.5–12.5)
Monocytes Relative: 7.5 %
Neutro Abs: 1686 cells/uL (ref 1500–7800)
Neutrophils Relative %: 24.8 %
Platelets: 434 10*3/uL — ABNORMAL HIGH (ref 140–400)
RBC: 4.12 10*6/uL (ref 3.80–5.10)
RDW: 13.5 % (ref 11.0–15.0)
Total Lymphocyte: 58.5 %
WBC: 6.8 10*3/uL (ref 3.8–10.8)

## 2020-11-18 LAB — TSH: TSH: 2.53 mIU/L (ref 0.40–4.50)

## 2020-12-02 ENCOUNTER — Other Ambulatory Visit: Payer: Medicaid Other | Admitting: Adult Health

## 2021-01-18 ENCOUNTER — Other Ambulatory Visit: Payer: Self-pay

## 2021-01-18 ENCOUNTER — Encounter: Payer: Self-pay | Admitting: Adult Health

## 2021-01-18 ENCOUNTER — Other Ambulatory Visit (HOSPITAL_COMMUNITY)
Admission: RE | Admit: 2021-01-18 | Discharge: 2021-01-18 | Disposition: A | Discharge status: Home / Self Care | Payer: Medicaid Other | Source: Ambulatory Visit | Attending: Adult Health | Admitting: Adult Health

## 2021-01-18 ENCOUNTER — Ambulatory Visit (INDEPENDENT_AMBULATORY_CARE_PROVIDER_SITE_OTHER): Payer: Medicaid Other | Admitting: Adult Health

## 2021-01-18 VITALS — BP 132/89 | HR 75 | Ht 63.0 in | Wt 123.2 lb

## 2021-01-18 DIAGNOSIS — Z1211 Encounter for screening for malignant neoplasm of colon: Secondary | ICD-10-CM | POA: Diagnosis not present

## 2021-01-18 DIAGNOSIS — Z01419 Encounter for gynecological examination (general) (routine) without abnormal findings: Secondary | ICD-10-CM

## 2021-01-18 DIAGNOSIS — R87810 Cervical high risk human papillomavirus (HPV) DNA test positive: Secondary | ICD-10-CM

## 2021-01-18 LAB — HEMOCCULT GUIAC POC 1CARD (OFFICE): Fecal Occult Blood, POC: NEGATIVE

## 2021-01-18 NOTE — Progress Notes (Signed)
Patient ID: Samantha Valenzuela, female   DOB: 29-Apr-1956, 65 y.o.   MRN: [id number] History of Present Illness:  Dhrithi is a 65 year old black female,single PM in for a well woman gyn exam and pap. She is a [employer] manager. PCP is Jeralyn Ruths NP.  Current Medications, Allergies, Past Medical History, Past Surgical History, Family History and Social History were reviewed in [employer] record.     Review of Systems: Patient denies any headaches, hearing loss, fatigue, blurred vision, shortness of breath, chest pain, abdominal pain, problems with bowel movements, urination, or intercourse. No joint pain or mood swings.    Physical Exam:BP 132/89 (BP Location: Right Arm, Patient Position: Sitting, Cuff Size: Normal)   Pulse 75   Ht 5\' 3"  (1.6 m)   Wt 123 lb 3.2 oz (55.9 kg)   BMI 21.82 kg/m  General:  Well developed, well nourished, no acute distress Skin:  Warm and dry Neck:  Midline trachea, normal thyroid, good ROM, no lymphadenopathy,no carotid bruits heard  Lungs; Clear to auscultation bilaterally Breast:  No dominant palpable mass, retraction, or nipple discharge Cardiovascular: Regular rate and rhythm Abdomen:  Soft, non tender, no hepatosplenomegaly Pelvic:  External genitalia is normal in appearance, no lesions.  The vagina is normal in appearance. Urethra has no lesions or masses. The cervix is smooth, pap with GC/CHL and HRHPV genotyping performed.  Uterus is felt to be normal size, shape, and contour.  No adnexal masses or tenderness noted.Bladder is non tender, no masses felt. Rectal: Good sphincter tone, no polyps, or hemorrhoids felt.  Hemoccult negative. Extremities/musculoskeletal:  No swelling or varicosities noted, no clubbing or cyanosis Psych:  No mood changes, alert and cooperative,seems happy AA is 1 Fall risk is low PHQ 9 score is 0 GAD 7 score is 0  Upstream - 01/18/21 1543      Pregnancy Intention Screening   Does the patient want to become pregnant in  the next year? N/A    Does the patient's partner want to become pregnant in the next year? N/A    Would the patient like to discuss contraceptive options today? No      Contraception Wrap Up   Current Method No Method - Other Reason   post-menopausal   End Method No Method - Other Reason   PM   Contraception Counseling Provided No          Examination chaperoned by Glenard Haring RN   Impression and Plan: 1. Encounter for gynecological examination with Papanicolaou smear of cervix Pap sent Physical in 1 year Pap in 3 if normal Labs with PCP Mammogram yearly  Colonoscopy per GI  2. 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 Encounter for screening fecal occult blood testing

## 2021-01-25 LAB — CYTOLOGY - PAP
Chlamydia: NEGATIVE
Comment: NEGATIVE
Comment: NEGATIVE
Comment: NORMAL
Diagnosis: NEGATIVE
High risk HPV: NEGATIVE
Neisseria Gonorrhea: NEGATIVE

## 2021-02-16 ENCOUNTER — Other Ambulatory Visit: Payer: Self-pay

## 2021-02-16 ENCOUNTER — Ambulatory Visit (HOSPITAL_COMMUNITY)
Admission: RE | Admit: 2021-02-16 | Discharge: 2021-02-16 | Disposition: A | Discharge status: Home / Self Care | Payer: Medicaid Other | Source: Ambulatory Visit | Attending: Nurse Practitioner | Admitting: Nurse Practitioner

## 2021-02-16 ENCOUNTER — Encounter (INDEPENDENT_AMBULATORY_CARE_PROVIDER_SITE_OTHER): Payer: Self-pay | Admitting: Nurse Practitioner

## 2021-02-16 ENCOUNTER — Ambulatory Visit (INDEPENDENT_AMBULATORY_CARE_PROVIDER_SITE_OTHER): Payer: Medicaid Other | Admitting: Nurse Practitioner

## 2021-02-16 VITALS — BP 162/100 | HR 75 | Temp 97.3°F | Ht 63.0 in | Wt 124.2 lb

## 2021-02-16 DIAGNOSIS — R059 Cough, unspecified: Secondary | ICD-10-CM | POA: Insufficient documentation

## 2021-02-16 DIAGNOSIS — E039 Hypothyroidism, unspecified: Secondary | ICD-10-CM

## 2021-02-16 DIAGNOSIS — I10 Essential (primary) hypertension: Secondary | ICD-10-CM

## 2021-02-16 DIAGNOSIS — R0602 Shortness of breath: Secondary | ICD-10-CM | POA: Diagnosis not present

## 2021-02-16 DIAGNOSIS — D75839 Thrombocytosis, unspecified: Secondary | ICD-10-CM

## 2021-02-16 DIAGNOSIS — E785 Hyperlipidemia, unspecified: Secondary | ICD-10-CM | POA: Diagnosis not present

## 2021-02-16 MED ORDER — LISINOPRIL 40 MG PO TABS: 40.0000 mg | ORAL_TABLET | Freq: Every day | ORAL | 1 refills | Status: DC

## 2021-02-16 MED ORDER — BENZONATATE 100 MG PO CAPS: 100.0000 mg | ORAL_CAPSULE | Freq: Two times a day (BID) | ORAL | 0 refills | Status: DC | PRN

## 2021-02-16 MED ORDER — ALBUTEROL SULFATE HFA 108 (90 BASE) MCG/ACT IN AERS
2.0000 | INHALATION_SPRAY | Freq: Four times a day (QID) | RESPIRATORY_TRACT | 2 refills | Status: AC | PRN
Start: 2021-02-16 — End: ?

## 2021-02-16 MED ORDER — METOPROLOL TARTRATE 25 MG PO TABS: 12.5000 mg | ORAL_TABLET | Freq: Two times a day (BID) | ORAL | 1 refills | Status: AC

## 2021-02-16 MED ORDER — LEVOTHYROXINE SODIUM 125 MCG PO TABS
125.0000 ug | ORAL_TABLET | Freq: Every day | ORAL | 1 refills | Status: AC
Start: 2021-02-16 — End: ?

## 2021-02-16 MED ORDER — NIFEDIPINE ER 90 MG PO TB24: 90.0000 mg | ORAL_TABLET | Freq: Every day | ORAL | 1 refills | Status: AC

## 2021-02-16 MED ORDER — ATORVASTATIN CALCIUM 20 MG PO TABS: 20.0000 mg | ORAL_TABLET | Freq: Every evening | ORAL | 1 refills | Status: AC

## 2021-02-16 NOTE — Progress Notes (Signed)
Subjective:  Patient ID: Lavanna Rog, female    DOB: 1956/03/24  Age: 65 y.o. MRN: [id number]  CC:  Chief Complaint  Patient presents with  . Follow-up    Has a cough and having pain in right side of chest, having SOB, going on for 2 weeks  . Hypothyroidism  . Hypertension  . Cough  . Other    Thrombocytosis  . Hyperlipidemia      HPI  This patient arrives today for the above.  Hypothyroidism: She continues on 125 mcg of levothyroxine daily.  Last TSH was collected about 3 months ago and was within normal range.  Hypertension: She continues on metoprolol, lisinopril, nifedipine.  Tolerating medication well.  She tells me she is out of her medications and needs refills today.  Cough: She is experiencing a nonproductive cough for the last 2 weeks.  She also been experiencing some fatigue and shortness of breath with exertion.  She did have a chest x-ray approximately 3 months ago for similar symptoms at which point the chest x-ray appeared to be negative for pneumonia.  She has been vaccinated against COVID-19 and has not had any exposures as far she knows.  She has tested for COVID-19 with an at-home test within the last couple of weeks.  Thrombocytosis: She has had mildly elevated platelets in the past.    Hyperlipidemia: Last lipid panel showed LDL of 97.  She is prescribed Lipitor 20 mg daily.  She is requesting a refill today.  Past Medical History:  Diagnosis Date  . Essential hypertension, benign 07/27/2019  . Gout   . HLD (hyperlipidemia) 07/27/2019  . Hypothyroidism, adult 07/27/2019  . Papanicolaou smear of cervix with positive high risk human papilloma virus (HPV) test   . Prediabetes 07/27/2019  . Vitamin D deficiency disease 07/27/2019      Family History  Problem Relation Age of Onset  . Dementia Father   . Hypertension Father   . Kidney disease Mother   . Cancer Daughter        ovarian  . Lupus Daughter   . Hypertension Sister   . Hyperlipidemia  Sister     Social History   Social History Narrative   Single.6 kids live with her.Unemployed .   Social History   Tobacco Use  . Smoking status: Current Every Day Smoker    Packs/day: 0.50    Years: 43.00    Pack years: 21.50    Types: Cigarettes  . Smokeless tobacco: Never Used  Substance Use Topics  . Alcohol use: Yes    Comment: occassionally     Current Meds  Medication Sig  . albuterol (VENTOLIN HFA) 108 (90 Base) MCG/ACT inhaler Inhale 2 puffs into the lungs every 6 (six) hours as needed for wheezing or shortness of breath.  . benzonatate (TESSALON) 100 MG capsule Take 1 capsule (100 mg total) by mouth 2 (two) times daily as needed for cough.  . cholecalciferol (VITAMIN D3) 25 MCG (1000 UT) tablet Take 10,000 Units by mouth daily.  . [DISCONTINUED] atorvastatin (LIPITOR) 20 MG tablet Take 1 tablet (20 mg total) by mouth every evening.  . [DISCONTINUED] levothyroxine (SYNTHROID) 125 MCG tablet Take 125 mcg by mouth daily before breakfast.  . [DISCONTINUED] lisinopril (ZESTRIL) 40 MG tablet Take 1 tablet (40 mg total) by mouth daily.  . [DISCONTINUED] metoprolol tartrate (LOPRESSOR) 25 MG tablet Take 0.5 tablets (12.5 mg total) by mouth 2 (two) times daily.  . [DISCONTINUED] NIFEdipine (ADALAT CC) 90  MG 24 hr tablet Take 1 tablet (90 mg total) by mouth daily.    ROS:  Review of Systems  Constitutional: Negative for chills and fever.  Respiratory: Positive for cough and shortness of breath. Negative for sputum production.   Cardiovascular: Positive for chest pain (Right sided).  Neurological: Negative for dizziness and headaches.     Objective:   Today's Vitals: BP (!) 162/100   Pulse 75   Temp (!) 97.3 F (36.3 C) (Temporal)   Ht $R'5\' 3"'ii$  (1.6 m)   Wt 124 lb 3.2 oz (56.3 kg)   SpO2 97%   BMI 22.00 kg/m  Vitals with BMI 02/16/2021 01/18/2021 11/17/2020  Height $Remov'5\' 3"'yOsCIl$  $Remove'5\' 3"'ppGuAdG$  $RemoveB'5\' 5"'hcohYSOy$   Weight 124 lbs 3 oz 123 lbs 3 oz 124 lbs 13 oz  BMI 22.01 33.00 76.22  Systolic  633 354 562  Diastolic 563 89 82  Pulse 75 75 66     Physical Exam Vitals reviewed.  Constitutional:      General: She is not in acute distress.    Appearance: Normal appearance.  HENT:     Head: Normocephalic and atraumatic.  Neck:     Vascular: No carotid bruit.  Cardiovascular:     Rate and Rhythm: Normal rate and regular rhythm.     Pulses: Normal pulses.     Heart sounds: Normal heart sounds.  Pulmonary:     Effort: Pulmonary effort is normal.     Breath sounds: Normal breath sounds.  Skin:    General: Skin is warm and dry.  Neurological:     General: No focal deficit present.     Mental Status: She is alert and oriented to person, place, and time.  Psychiatric:        Mood and Affect: Mood normal.        Behavior: Behavior normal.        Judgment: Judgment normal.          Assessment and Plan   1. Cough   2. Essential hypertension, benign   3. Hyperlipidemia, unspecified hyperlipidemia type   4. Hypothyroidism, adult   5. Thrombocytosis      Plan: 1.  Exam is fairly benign except for the fact that she does have a cough today.  Lung sounds did appear normal.  We will send her for x-ray.  If this appears normal we will probably refer her to pulmonology as she has these repetitive respiratory infections and she may need further evaluation regarding this.  Will prescribe cough suppressant that she can take as needed and as well as albuterol inhaler for as needed shortness of breath. 2.  Blood pressure elevated today but she did not take her medication today we will refill these today for her. 3.  She will continue on Lipitor refill sent in today. 4.  We will refill levothyroxine. 5.  We will recheck CBC if platelets remain elevated consider seeing hematology.     Tests ordered Orders Placed This Encounter  Procedures  . DG Chest 2 View  . CBC with Differential/Platelets  . CMP with eGFR([facility])      Meds ordered this encounter  Medications  .  atorvastatin (LIPITOR) 20 MG tablet    Sig: Take 1 tablet (20 mg total) by mouth every evening.    Dispense:  90 tablet    Refill:  1    Order Specific Question:   Supervising Provider    Answer:   Hurshel Party C [[id number]]  . levothyroxine (SYNTHROID)  125 MCG tablet    Sig: Take 1 tablet (125 mcg total) by mouth daily before breakfast.    Dispense:  90 tablet    Refill:  1    Order Specific Question:   Supervising Provider    Answer:   Hurshel Party C [[id number]]  . lisinopril (ZESTRIL) 40 MG tablet    Sig: Take 1 tablet (40 mg total) by mouth daily.    Dispense:  90 tablet    Refill:  1    Order Specific Question:   Supervising Provider    Answer:   Hurshel Party C [[id number]]  . metoprolol tartrate (LOPRESSOR) 25 MG tablet    Sig: Take 0.5 tablets (12.5 mg total) by mouth 2 (two) times daily.    Dispense:  180 tablet    Refill:  1    Order Specific Question:   Supervising Provider    Answer:   Hurshel Party C [[id number]]  . NIFEdipine (ADALAT CC) 90 MG 24 hr tablet    Sig: Take 1 tablet (90 mg total) by mouth daily.    Dispense:  90 tablet    Refill:  1    Order Specific Question:   Supervising Provider    Answer:   Hurshel Party C [[id number]]  . benzonatate (TESSALON) 100 MG capsule    Sig: Take 1 capsule (100 mg total) by mouth 2 (two) times daily as needed for cough.    Dispense:  20 capsule    Refill:  0    Order Specific Question:   Supervising Provider    Answer:   Hurshel Party C [[id number]]  . albuterol (VENTOLIN HFA) 108 (90 Base) MCG/ACT inhaler    Sig: Inhale 2 puffs into the lungs every 6 (six) hours as needed for wheezing or shortness of breath.    Dispense:  8 g    Refill:  2    Order Specific Question:   Supervising Provider    Answer:   Doree Albee [[id number]]    Patient to follow-up in 4 weeks with Dr. Anastasio Champion.  Ailene Ards, NP

## 2021-02-17 ENCOUNTER — Other Ambulatory Visit (INDEPENDENT_AMBULATORY_CARE_PROVIDER_SITE_OTHER): Payer: Self-pay | Admitting: Nurse Practitioner

## 2021-02-17 DIAGNOSIS — R053 Chronic cough: Secondary | ICD-10-CM

## 2021-02-17 DIAGNOSIS — D75839 Thrombocytosis, unspecified: Secondary | ICD-10-CM

## 2021-02-17 LAB — COMPLETE METABOLIC PANEL WITH GFR
AG Ratio: 1.8 (calc) (ref 1.0–2.5)
ALT: 19 U/L (ref 6–29)
AST: 27 U/L (ref 10–35)
Albumin: 4.4 g/dL (ref 3.6–5.1)
Alkaline phosphatase (APISO): 67 U/L (ref 37–153)
BUN: 9 mg/dL (ref 7–25)
CO2: 26 mmol/L (ref 20–32)
Calcium: 9.6 mg/dL (ref 8.6–10.4)
Chloride: 102 mmol/L (ref 98–110)
Creat: 0.64 mg/dL (ref 0.50–0.99)
GFR, Est African American: 109 mL/min/{1.73_m2} (ref >=60)
GFR, Est Non African American: 94 mL/min/{1.73_m2} (ref >=60)
Globulin: 2.5 g/dL (calc) (ref 1.9–3.7)
Glucose, Bld: 82 mg/dL (ref 65–99)
Potassium: 4 mmol/L (ref 3.5–5.3)
Sodium: 137 mmol/L (ref 135–146)
Total Bilirubin: 0.5 mg/dL (ref 0.2–1.2)
Total Protein: 6.9 g/dL (ref 6.1–8.1)

## 2021-02-17 LAB — CBC WITH DIFFERENTIAL/PLATELET
Absolute Monocytes: 353 cells/uL (ref 200–950)
Basophils Absolute: 50 cells/uL (ref 0–200)
Basophils Relative: 0.8 %
Eosinophils Absolute: 298 cells/uL (ref 15–500)
Eosinophils Relative: 4.8 %
HCT: 37.2 % (ref 35.0–45.0)
Hemoglobin: 12.1 g/dL (ref 11.7–15.5)
Lymphs Abs: 3726 cells/uL (ref 850–3900)
MCH: 28.1 pg (ref 27.0–33.0)
MCHC: 32.5 g/dL (ref 32.0–36.0)
MCV: 86.3 fL (ref 80.0–100.0)
MPV: 9.2 fL (ref 7.5–12.5)
Monocytes Relative: 5.7 %
Neutro Abs: 1773 cells/uL (ref 1500–7800)
Neutrophils Relative %: 28.6 %
Platelets: 472 10*3/uL — ABNORMAL HIGH (ref 140–400)
RBC: 4.31 10*6/uL (ref 3.80–5.10)
RDW: 13.1 % (ref 11.0–15.0)
Total Lymphocyte: 60.1 %
WBC: 6.2 10*3/uL (ref 3.8–10.8)

## 2021-03-10 ENCOUNTER — Other Ambulatory Visit: Payer: Self-pay

## 2021-03-10 ENCOUNTER — Encounter (HOSPITAL_COMMUNITY): Payer: Self-pay

## 2021-03-10 ENCOUNTER — Encounter (HOSPITAL_COMMUNITY): Payer: Self-pay | Admitting: Hematology and Oncology

## 2021-03-10 ENCOUNTER — Inpatient Hospital Stay (HOSPITAL_COMMUNITY): Payer: Medicaid Other

## 2021-03-10 ENCOUNTER — Inpatient Hospital Stay (HOSPITAL_COMMUNITY): Payer: Medicaid Other | Attending: Hematology and Oncology | Admitting: Hematology and Oncology

## 2021-03-10 VITALS — BP 115/73 | HR 67 | Temp 97.3°F | Resp 20 | Ht 63.0 in | Wt 120.6 lb

## 2021-03-10 DIAGNOSIS — I10 Essential (primary) hypertension: Secondary | ICD-10-CM | POA: Insufficient documentation

## 2021-03-10 DIAGNOSIS — Z8041 Family history of malignant neoplasm of ovary: Secondary | ICD-10-CM | POA: Diagnosis not present

## 2021-03-10 DIAGNOSIS — D75839 Thrombocytosis, unspecified: Secondary | ICD-10-CM

## 2021-03-10 DIAGNOSIS — F17210 Nicotine dependence, cigarettes, uncomplicated: Secondary | ICD-10-CM

## 2021-03-10 LAB — COMPREHENSIVE METABOLIC PANEL
ALT: 21 U/L (ref 0–44)
AST: 22 U/L (ref 15–41)
Albumin: 4.4 g/dL (ref 3.5–5.0)
Alkaline Phosphatase: 83 U/L (ref 38–126)
Anion gap: 8 (ref 5–15)
BUN: 17 mg/dL (ref 8–23)
CO2: 27 mmol/L (ref 22–32)
Calcium: 9.6 mg/dL (ref 8.9–10.3)
Chloride: 102 mmol/L (ref 98–111)
Creatinine, Ser: 0.63 mg/dL (ref 0.44–1.00)
GFR, Estimated: >60 mL/min (ref >60)
Glucose, Bld: 110 mg/dL — ABNORMAL HIGH (ref 70–99)
Potassium: 4.1 mmol/L (ref 3.5–5.1)
Sodium: 137 mmol/L (ref 135–145)
Total Bilirubin: 0.6 mg/dL (ref 0.3–1.2)
Total Protein: 8.2 g/dL — ABNORMAL HIGH (ref 6.5–8.1)

## 2021-03-10 LAB — CBC WITH DIFFERENTIAL/PLATELET
Abs Immature Granulocytes: 0.02 10*3/uL (ref 0.00–0.07)
Basophils Absolute: 0.1 10*3/uL (ref 0.0–0.1)
Basophils Relative: 1 %
Eosinophils Absolute: 0.5 10*3/uL (ref 0.0–0.5)
Eosinophils Relative: 7 %
HCT: 39.8 % (ref 36.0–46.0)
Hemoglobin: 13.1 g/dL (ref 12.0–15.0)
Immature Granulocytes: 0 %
Lymphocytes Relative: 51 %
Lymphs Abs: 3.4 10*3/uL (ref 0.7–4.0)
MCH: 28.8 pg (ref 26.0–34.0)
MCHC: 32.9 g/dL (ref 30.0–36.0)
MCV: 87.5 fL (ref 80.0–100.0)
Monocytes Absolute: 0.4 10*3/uL (ref 0.1–1.0)
Monocytes Relative: 6 %
Neutro Abs: 2.3 10*3/uL (ref 1.7–7.7)
Neutrophils Relative %: 35 %
Platelets: 409 10*3/uL — ABNORMAL HIGH (ref 150–400)
RBC: 4.55 MIL/uL (ref 3.87–5.11)
RDW: 14.1 % (ref 11.5–15.5)
WBC: 6.7 10*3/uL (ref 4.0–10.5)
nRBC: 0 % (ref 0.0–0.2)

## 2021-03-10 LAB — IRON AND TIBC
Iron: 89 ug/dL (ref 28–170)
Saturation Ratios: 21 % (ref 10.4–31.8)
TIBC: 418 ug/dL (ref 250–450)
UIBC: 329 ug/dL

## 2021-03-10 LAB — FERRITIN: Ferritin: 183 ng/mL (ref 11–307)

## 2021-03-10 NOTE — Progress Notes (Signed)
[name] CONSULT NOTE  Patient Care Team: Ailene Ards, NP as PCP - General (Nurse Practitioner)  CHIEF COMPLAINTS/PURPOSE OF CONSULTATION:   Thrombocytosis  ASSESSMENT & PLAN:  Thrombocytosis This is a very pleasant 65 year old female patient with no significant past medical history except for hypertension and dyslipidemia referred to hematology for evaluation of mild thrombocytosis.  Patient has been feeling very well overall.  No concerning review of systems.  No physical examination findings.  I reviewed her labs which showed mild thrombocytosis.  We have discussed about common causes of thrombocytosis including but not limited to infections, inflammation, iron deficiency anemia, myeloproliferative disorders.  We have agreed to proceed with myeloproliferative work-up given her age and given persistent thrombocytosis.  She will return to clinic in a few weeks to discuss any additional recommendations. Thank you for consulting [place] in the care of this patient.  Please do not hesitate to contact us with any additional questions or concerns.  Orders Placed This Encounter  Procedures  . CBC with Differential/Platelet    Standing Status:   Standing    Number of Occurrences:   22    Standing Expiration Date:   03/10/2022  . Comprehensive metabolic panel    Standing Status:   Standing    Number of Occurrences:   33    Standing Expiration Date:   03/10/2022  . Iron and TIBC    Standing Status:   Future    Number of Occurrences:   1    Standing Expiration Date:   03/10/2022  . Ferritin    Standing Status:   Future    Number of Occurrences:   1    Standing Expiration Date:   03/10/2022  . JAK2 V617F, w Reflex to CALR/E12/MPL  . Erythropoietin    Standing Status:   Future    Number of Occurrences:   1    Standing Expiration Date:   03/10/2022     HISTORY OF PRESENTING ILLNESS:   Samantha Valenzuela 65 y.o. female is here because of thrombocytosis.  This is a very pleasant  65 year old female patient who is in good health except for benign essential hypertension, dyslipidemia referred to hematology for evaluation of mild thrombocytosis.   Patient arrived to the appointment today by herself.  She tells me that she is recently moved to [place] and hence establishing with local doctors.  She has no health complaints.  She feels very well.  No fevers, drenching night sweats, loss of appetite or loss of weight.  No change in breathing, bowel habits or urinary habits.  No new neurological complaints.  No history of DVT/PE, erythromelalgia or intractable pruritus. Rest of the pertinent 10 point ROS reviewed and negative.  REVIEW OF SYSTEMS:   Constitutional: Denies fevers, chills or abnormal night sweats Eyes: Denies blurriness of vision, double vision or watery eyes Ears, nose, mouth, throat, and face: Denies mucositis or sore throat Respiratory: Denies cough, dyspnea or wheezes Cardiovascular: Denies palpitation, chest discomfort or lower extremity swelling Gastrointestinal:  Denies nausea, heartburn or change in bowel habits Skin: Denies abnormal skin rashes Lymphatics: Denies new lymphadenopathy or easy bruising Neurological:Denies numbness, tingling or new weaknesses Behavioral/Psych: Mood is stable, no new changes  All other systems were reviewed with the patient and are negative.  MEDICAL HISTORY:  Past Medical History:  Diagnosis Date  . Essential hypertension, benign 07/27/2019  . Gout   . HLD (hyperlipidemia) 07/27/2019  . Hypothyroidism, adult 07/27/2019  . Papanicolaou smear of cervix with  positive high risk human papilloma virus (HPV) test   . Prediabetes 07/27/2019  . Vitamin D deficiency disease 07/27/2019    SURGICAL HISTORY: Past Surgical History:  Procedure Laterality Date  . COLONOSCOPY N/A 10/15/2019   Procedure: COLONOSCOPY;  Surgeon: Rogene Houston, MD;  Location: [facility];  Service: Endoscopy;  Laterality: N/A;  830  . none    .  POLYPECTOMY  10/15/2019   Procedure: POLYPECTOMY;  Surgeon: Rogene Houston, MD;  Location: [facility];  Service: Endoscopy;;   ascending colon    SOCIAL HISTORY: Social History   Socioeconomic History  . Marital status: Single    Spouse name: Not on file  . Number of children: Not on file  . Years of education: Not on file  . Highest education level: Not on file  Occupational History  . Not on file  Tobacco Use  . Smoking status: Current Every Day Smoker    Packs/day: 0.50    Years: 43.00    Pack years: 21.50    Types: Cigarettes  . Smokeless tobacco: Never Used  Vaping Use  . Vaping Use: Never used  Substance and Sexual Activity  . Alcohol use: Yes    Comment: occassionally  . Drug use: Not Currently  . Sexual activity: Not Currently    Birth control/protection: Post-menopausal  Other Topics Concern  . Not on file  Social History Narrative   Single.6 kids live with her.Unemployed .   Social Determinants of Health   Financial Resource Strain: Low Risk   . Difficulty of Paying Living Expenses: Not hard at all  Food Insecurity: No Food Insecurity  . Worried About Charity fundraiser in the Last Year: Never true  . Ran Out of Food in the Last Year: Never true  Transportation Needs: No Transportation Needs  . Lack of Transportation (Medical): No  . Lack of Transportation (Non-Medical): No  Physical Activity: Inactive  . Days of Exercise per Week: 0 days  . Minutes of Exercise per Session: 0 min  Stress: No Stress Concern Present  . Feeling of Stress : Not at all  Social Connections: Unknown  . Frequency of Communication with Friends and Family: More than three times a week  . Frequency of Social Gatherings with Friends and Family: More than three times a week  . Attends Religious Services: Patient refused  . Active Member of Clubs or Organizations: Yes  . Attends Archivist Meetings: Never  . Marital Status: Never married  Intimate Partner Violence:  Not At Risk  . Fear of Current or Ex-Partner: No  . Emotionally Abused: No  . Physically Abused: No  . Sexually Abused: No    FAMILY HISTORY: Family History  Problem Relation Age of Onset  . Dementia Father   . Hypertension Father   . Kidney disease Mother   . Cancer Daughter        ovarian  . Lupus Daughter   . Hypertension Sister   . Hypertension Sister   . Hyperlipidemia Sister     ALLERGIES:  has No Known Allergies.  MEDICATIONS:  Current Outpatient Medications  Medication Sig Dispense Refill  . albuterol (VENTOLIN HFA) 108 (90 Base) MCG/ACT inhaler Inhale 2 puffs into the lungs every 6 (six) hours as needed for wheezing or shortness of breath. 8 g 2  . atorvastatin (LIPITOR) 20 MG tablet Take 1 tablet (20 mg total) by mouth every evening. 90 tablet 1  . benzonatate (TESSALON) 100 MG capsule Take  1 capsule (100 mg total) by mouth 2 (two) times daily as needed for cough. 20 capsule 0  . cholecalciferol (VITAMIN D3) 25 MCG (1000 UT) tablet Take 10,000 Units by mouth daily.    Marland Kitchen levothyroxine (SYNTHROID) 125 MCG tablet Take 1 tablet (125 mcg total) by mouth daily before breakfast. 90 tablet 1  . lisinopril (ZESTRIL) 40 MG tablet Take 1 tablet (40 mg total) by mouth daily. 90 tablet 1  . metoprolol tartrate (LOPRESSOR) 25 MG tablet Take 0.5 tablets (12.5 mg total) by mouth 2 (two) times daily. 180 tablet 1  . NIFEdipine (ADALAT CC) 90 MG 24 hr tablet Take 1 tablet (90 mg total) by mouth daily. 90 tablet 1   No current facility-administered medications for this visit.    PHYSICAL EXAMINATION:  ECOG PERFORMANCE STATUS: 0 - Asymptomatic  Vitals:   03/10/21 1132  BP: 115/73  Pulse: 67  Resp: 20  Temp: (!) 97.3 F (36.3 C)  SpO2: 99%   Filed Weights   03/10/21 1132  Weight: 120 lb 9.6 oz (54.7 kg)    GENERAL:alert, no distress and comfortable SKIN: skin color, texture, turgor are normal, no rashes or significant lesions EYES: normal, conjunctiva are pink and  non-injected, sclera clear OROPHARYNX:no exudate, no erythema and lips, buccal mucosa, and tongue normal  NECK: supple, thyroid normal size, non-tender, without nodularity LYMPH:  no palpable lymphadenopathy in the cervical, axillary or inguinal LUNGS: clear to auscultation and percussion with normal breathing effort HEART: regular rate & rhythm and no murmurs and no lower extremity edema ABDOMEN:abdomen soft, non-tender and normal bowel sounds Musculoskeletal:no cyanosis of digits and no clubbing  PSYCH: alert & oriented x 3 with fluent speech NEURO: no focal motor/sensory deficits  LABORATORY DATA:  I have reviewed the data as listed Lab Results  Component Value Date   WBC 6.7 03/10/2021   HGB 13.1 03/10/2021   HCT 39.8 03/10/2021   MCV 87.5 03/10/2021   PLT 409 (H) 03/10/2021     Chemistry      Component Value Date/Time   NA 137 03/10/2021 1230   K 4.1 03/10/2021 1230   CL 102 03/10/2021 1230   CO2 27 03/10/2021 1230   BUN 17 03/10/2021 1230   CREATININE 0.63 03/10/2021 1230   CREATININE 0.64 02/16/2021 0000      Component Value Date/Time   CALCIUM 9.6 03/10/2021 1230   ALKPHOS 83 03/10/2021 1230   AST 22 03/10/2021 1230   ALT 21 03/10/2021 1230   BILITOT 0.6 03/10/2021 1230     Have reviewed pertinent labs, mild thrombocytosis which has been ongoing for some time now.  No other concerning findings on the CBC.  RADIOGRAPHIC STUDIES: I have personally reviewed the radiological images as listed and agreed with the findings in the report. DG Chest 2 View  Result Date: 02/17/2021 CLINICAL DATA:  Cough and shortness of breath for 2 weeks. EXAM: CHEST - 2 VIEW COMPARISON:  10/14/2020 FINDINGS: The cardiac silhouette, mediastinal and hilar contours are within normal limits and stable. Mild tortuosity of the thoracic aorta. Moderate hyperinflation is a stable finding. No infiltrates, edema or effusions. No pulmonary lesions. The bony thorax is intact. IMPRESSION:  Hyperinflation but no acute pulmonary findings. Electronically Signed   By: Marijo Sanes M.D.   On: 02/17/2021 14:00    All questions were answered. The patient knows to call the clinic with any problems, questions or concerns. I spent 30  minutes in the care of this patient including H and  P, review of records, counseling and coordination of care.     Benay Pike, MD 03/11/2021 1:41 PM

## 2021-03-11 DIAGNOSIS — D75839 Thrombocytosis, unspecified: Secondary | ICD-10-CM | POA: Insufficient documentation

## 2021-03-11 NOTE — Assessment & Plan Note (Signed)
This is a very pleasant 65 year old female patient with no significant past medical history except for hypertension and dyslipidemia referred to hematology for evaluation of mild thrombocytosis.  Patient has been feeling very well overall.  No concerning review of systems.  No physical examination findings.  I reviewed her labs which showed mild thrombocytosis.  We have discussed about common causes of thrombocytosis including but not limited to infections, inflammation, iron deficiency anemia, myeloproliferative disorders.  We have agreed to proceed with myeloproliferative work-up given her age and given persistent thrombocytosis.  She will return to clinic in a few weeks to discuss any additional recommendations. Thank you for consulting [place] in the care of this patient.  Please do not hesitate to contact us with any additional questions or concerns.

## 2021-03-13 LAB — ERYTHROPOIETIN: Erythropoietin: 9.3 m[IU]/mL (ref 2.6–18.5)

## 2021-03-14 ENCOUNTER — Other Ambulatory Visit: Payer: Self-pay | Admitting: Internal Medicine

## 2021-03-14 ENCOUNTER — Other Ambulatory Visit: Payer: Self-pay

## 2021-03-14 ENCOUNTER — Ambulatory Visit: Payer: Medicaid Other | Admitting: Internal Medicine

## 2021-03-14 ENCOUNTER — Encounter: Payer: Self-pay | Admitting: Internal Medicine

## 2021-03-14 DIAGNOSIS — R058 Other specified cough: Secondary | ICD-10-CM

## 2021-03-14 DIAGNOSIS — I10 Essential (primary) hypertension: Secondary | ICD-10-CM

## 2021-03-14 MED ORDER — FAMOTIDINE 20 MG PO TABS: ORAL_TABLET | ORAL | 11 refills | Status: AC

## 2021-03-14 MED ORDER — IRBESARTAN 150 MG PO TABS
150.0000 mg | ORAL_TABLET | Freq: Every day | ORAL | 11 refills | Status: AC
Start: 2021-03-14 — End: ?

## 2021-03-14 NOTE — Progress Notes (Signed)
Samantha Valenzuela, female    DOB: 08-Nov-1955,    MRN: [id number]   Brief patient profile:  34 yobf active smoker  on ACEi with new onset cough/sob since covid 19 followed as outpt for uri -like symptoms      History of Present Illness  03/14/2021  Pulmonary/ 1st office eval/ [facility] / [place] [facility]  Chief Complaint  Patient presents with  . Pulmonary Consult    Referred by Dr. Jeralyn Ruths. Pt c/o DOE since had covid 19 approx a year ago. She sometimes gets SOB walking across the room and other days she does not. She also c/o dry cough.    Dyspnea:  Sometimes across the room, sometimes across the parking lot  Cough: dry / with gagging q night  Sleep: flat bed / 2 pillows  SABA use: not sure helps   No obvious day to day or daytime variability or assoc excess/ purulent sputum or mucus plugs or hemoptysis or cp or chest tightness, subjective wheeze or overt sinus or hb symptoms.     Also denies any obvious fluctuation of symptoms with weather or environmental changes or other aggravating or alleviating factors except as outlined above   No unusual exposure hx or h/o childhood pna/ asthma or knowledge of premature birth.  Current Allergies, Complete Past Medical History, Past Surgical History, Family History, and Social History were reviewed in [employer] record.  ROS  The following are not active complaints unless bolded Hoarseness, sore throat, dysphagia, dental problems, itching, sneezing,  nasal congestion or discharge of excess mucus or purulent secretions, ear ache,   fever, chills, sweats, unintended wt loss or wt gain, classically pleuritic or exertional cp,  orthopnea pnd or arm/hand swelling  or leg swelling, presyncope, palpitations, abdominal pain, anorexia, nausea, vomiting, diarrhea  or change in bowel habits or change in bladder habits, change in stools or change in urine, dysuria, hematuria,  rash, arthralgias, visual complaints, headache, numbness,  weakness or ataxia or problems with walking or coordination,  change in mood or  memory.            Past Medical History:  Diagnosis Date  . Essential hypertension, benign 07/27/2019  . Gout   . HLD (hyperlipidemia) 07/27/2019  . Hypothyroidism, adult 07/27/2019  . Papanicolaou smear of cervix with positive high risk human papilloma virus (HPV) test   . Prediabetes 07/27/2019  . Vitamin D deficiency disease 07/27/2019    Outpatient Medications Prior to Visit  Medication Sig Dispense Refill  . albuterol (VENTOLIN HFA) 108 (90 Base) MCG/ACT inhaler Inhale 2 puffs into the lungs every 6 (six) hours as needed for wheezing or shortness of breath. 8 g 2  . atorvastatin (LIPITOR) 20 MG tablet Take 1 tablet (20 mg total) by mouth every evening. 90 tablet 1  . benzonatate (TESSALON) 100 MG capsule Take 1 capsule (100 mg total) by mouth 2 (two) times daily as needed for cough. 20 capsule 0  . cholecalciferol (VITAMIN D3) 25 MCG (1000 UT) tablet Take 10,000 Units by mouth daily.    Marland Kitchen levothyroxine (SYNTHROID) 125 MCG tablet Take 1 tablet (125 mcg total) by mouth daily before breakfast. 90 tablet 1  . lisinopril (ZESTRIL) 40 MG tablet Take 1 tablet (40 mg total) by mouth daily. 90 tablet 1  . metoprolol tartrate (LOPRESSOR) 25 MG tablet Take 0.5 tablets (12.5 mg total) by mouth 2 (two) times daily. 180 tablet 1  . NIFEdipine (ADALAT CC) 90 MG 24 hr tablet Take 1  tablet (90 mg total) by mouth daily. 90 tablet 1   No facility-administered medications prior to visit.     Objective:     BP (!) 162/94 (BP Location: Left Arm, Cuff Size: Normal)   Pulse 81   Temp (!) 97.2 F (36.2 C) (Temporal)   Ht 5\' 3"  (1.6 m)   Wt 121 lb 12.8 oz (55.2 kg)   SpO2 98% Comment: on RA  BMI 21.58 kg/m   SpO2: 98 % (on RA)   amb thin bf nad/ freq throat clearing     HEENT : pt wearing mask not removed for exam due to covid -19 concerns.    NECK :  without JVD/Nodes/TM/ nl carotid upstrokes  bilaterally   LUNGS: no acc muscle use,  Nl contour chest which is clear to A and P bilaterally without cough on insp or exp maneuvers   CV:  RRR  no s3 or murmur or increase in P2, and no edema   ABD:  soft and nontender with nl inspiratory excursion in the supine position. No bruits or organomegaly appreciated, bowel sounds nl  MS:  Nl gait/ ext warm without deformities, calf tenderness, cyanosis or clubbing No obvious joint restrictions   SKIN: warm and dry without lesions    NEURO:  alert, approp, nl sensorium with  no motor or cerebellar deficits apparent.    I personally reviewed images and agree with radiology impression as follows:  CXR:    02/16/21 PA and lat: Hyperinflation but no acute pulmonary findings.    Assessment   No problem-specific Assessment & Plan notes found for this encounter.     Christinia Gully, MD 03/14/2021

## 2021-03-14 NOTE — Assessment & Plan Note (Signed)
Onset spring 2021 with covid 19 assoc with variable sob - try off acei 03/14/2021  - 03/14/2021  After extensive coaching inhaler device,  effectiveness =  75% (delay between trigger in insp)   Upper airway cough syndrome (previously labeled PNDS),  is so named because it's frequently impossible to sort out how much is  CR/sinusitis with freq throat clearing (which can be related to primary GERD)   vs  causing  secondary (" extra esophageal")  GERD from wide swings in gastric pressure that occur with throat clearing, often  promoting self use of mint and menthol lozenges that reduce the lower esophageal sphincter tone and exacerbate the problem further in a cyclical fashion.   These are the same pts (now being labeled as having "irritable larynx syndrome" by some cough centers) who not infrequently have a history of having failed to tolerate ace inhibitors,  dry powder inhalers or biphosphonates or report having atypical/extraesophageal reflux symptoms that don't respond to standard doses of PPI  and are easily confused as having aecopd or asthma flares by even experienced allergists/ pulmonologists (myself included).    Try off acei and on pepcid 20 mg qhs pending f/u here in 6-8 weeks.          Each maintenance medication was reviewed in detail including emphasizing most importantly the difference between maintenance and prns and under what circumstances the prns are to be triggered using an action plan format where appropriate.  Total time for H and P, chart review, counseling, reviewing hfa device(s) and generating customized AVS unique to this office visit / same day charting = 38 min

## 2021-03-14 NOTE — Assessment & Plan Note (Signed)
D/C ACi 03/14/2021 due to cough / noct gagging  In the best review of chronic cough to date ( NEJM 2016 375 [id number]) ,  ACEi are now felt to cause cough in up to  20% of pts which is a 4 fold increase from previous reports and does not include the variety of non-specific complaints we see in pulmonary clinic in pts on ACEi but previously attributed to another dx like  Copd/asthma and  include PNDS, throat and chest congestion, "bronchitis", unexplained dyspnea and noct "strangling" " gagging"  sensations, and hoarseness, but also  atypical /refractory GERD symptoms like dysphagia and "bad heartburn"   The only way I know  to prove this is not an "ACEi Case" is a trial off ACEi x a minimum of 6 weeks then regroup.   Try avapro 150 mg one daily and return in 6-8 weeks for pfts

## 2021-03-14 NOTE — Patient Instructions (Signed)
Only use your albuterol  (PROAIR) as a rescue medication to be used if you can't catch your breath by resting or doing a relaxed purse lip breathing pattern.  - The less you use it, the better it will work when you need it. - Ok to use up to 2 puffs  every 4 hours if you must but call for immediate appointment if use goes up over your usual need - Don't leave home without it !!  (think of it like the spare tire for your car)   Work on inhaler technique:  relax and gently blow all the way out then take a nice smooth deep breath back in, triggering the inhaler at same time you start breathing in.  Hold for up to 5 seconds if you can.  Rinse and gargle with water when done  Stop lisinopril and replace with avapro 150 mg (ibesartan) one daily in its place  The key is to stop smoking completely before smoking completely stops you!   Please schedule a follow up office visit in 6-8 weeks with pfts on return

## 2021-03-20 ENCOUNTER — Other Ambulatory Visit (INDEPENDENT_AMBULATORY_CARE_PROVIDER_SITE_OTHER): Payer: Self-pay | Admitting: Nurse Practitioner

## 2021-03-20 ENCOUNTER — Encounter (INDEPENDENT_AMBULATORY_CARE_PROVIDER_SITE_OTHER): Payer: Self-pay | Admitting: Nurse Practitioner

## 2021-03-20 DIAGNOSIS — Z720 Tobacco use: Secondary | ICD-10-CM

## 2021-03-20 MED ORDER — NICOTINE 7 MG/24HR TD PT24: 7.0000 mg | MEDICATED_PATCH | Freq: Every day | TRANSDERMAL | 1 refills | Status: AC

## 2021-03-20 MED ORDER — NICOTINE 21 MG/24HR TD PT24: 21.0000 mg | MEDICATED_PATCH | Freq: Every day | TRANSDERMAL | 0 refills | Status: AC

## 2021-03-20 MED ORDER — NICOTINE 14 MG/24HR TD PT24: 14.0000 mg | MEDICATED_PATCH | Freq: Every day | TRANSDERMAL | 0 refills | Status: AC

## 2021-03-24 ENCOUNTER — Other Ambulatory Visit: Payer: Self-pay

## 2021-03-24 ENCOUNTER — Inpatient Hospital Stay (HOSPITAL_BASED_OUTPATIENT_CLINIC_OR_DEPARTMENT_OTHER): Payer: Medicaid Other | Admitting: Hematology and Oncology

## 2021-03-24 VITALS — BP 112/76 | HR 58 | Temp 98.6°F | Resp 18 | Wt 122.6 lb

## 2021-03-24 DIAGNOSIS — D75839 Thrombocytosis, unspecified: Secondary | ICD-10-CM | POA: Diagnosis not present

## 2021-03-24 NOTE — Assessment & Plan Note (Signed)
This is a very pleasant 65 yr old female referred to [place] for mild thrombocytosis. During initial consultation, we discussed about causes of thrombocytosis including primary and secondary thrombocytosis. We discussed about proceeding with MPN work up, still pending EPO levels, Iron levels normal. CBC showed improving platelet count. 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 We will inform her of JAK2 results if this suggests ET. Otherwise, she can FU in 6 months with repeat labs, she was encouraged to contact us with any questions. Smoking cessation counseling advised.

## 2021-03-24 NOTE — Progress Notes (Signed)
[facility] CONSULT NOTE  Patient Care Team: Ailene Ards, NP as PCP - General (Nurse Practitioner)  CHIEF COMPLAINTS/PURPOSE OF CONSULTATION:   Thrombocytosis  ASSESSMENT & PLAN:  Thrombocytosis This is a very pleasant 65 yr old female referred to [place] for mild thrombocytosis. During initial consultation, we discussed about causes of thrombocytosis including primary and secondary thrombocytosis. We discussed about proceeding with MPN work up, still pending EPO levels, Iron levels normal. CBC showed improving platelet count. 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 We will inform her of JAK2 results if this suggests ET. Otherwise, she can FU in 6 months with repeat labs, she was encouraged to contact us with any questions. Smoking cessation counseling advised.  No orders of the defined types were placed in this encounter.    HISTORY OF PRESENTING ILLNESS:   Samantha Valenzuela 65 y.o. female is here because of thrombocytosis.  This is a very pleasant 65 year old female patient who is in good health except for benign essential hypertension, dyslipidemia referred to hematology for evaluation of mild thrombocytosis.    Interval History  Patient is doing very well today.  Since her last visit, no interim complaints.  She quit smoking about 5 days ago doing well.  Her cough has been getting better.  No other B symptoms.  No change in medications.  Rest of the pertinent review of systems reviewed and negative.  MEDICAL HISTORY:  Past Medical History:  Diagnosis Date  . Essential hypertension, benign 07/27/2019  . Gout   . HLD (hyperlipidemia) 07/27/2019  . Hypothyroidism, adult 07/27/2019  . Papanicolaou smear of cervix with positive high risk human papilloma virus (HPV) test   . Prediabetes 07/27/2019  . Vitamin D deficiency disease 07/27/2019    SURGICAL HISTORY: Past Surgical History:  Procedure Laterality Date   . COLONOSCOPY N/A 10/15/2019   Procedure: COLONOSCOPY;  Surgeon: Rogene Houston, MD;  [facility];  Service: Endoscopy;  Laterality: N/A;  830  . none    . POLYPECTOMY  10/15/2019   Procedure: POLYPECTOMY;  Surgeon: Rogene Houston, MD;  Location: [facility];  Service: Endoscopy;;   ascending colon    SOCIAL HISTORY: Social History   Socioeconomic History  . Marital status: Single    Spouse name: Not on file  . Number of children: Not on file  . Years of education: Not on file  . Highest education level: Not on file  Occupational History  . Not on file  Tobacco Use  . Smoking status: Current Every Day Smoker    Packs/day: 0.50    Years: 43.00    Pack years: 21.50    Types: Cigarettes  . Smokeless tobacco: Never Used  Vaping Use  . Vaping Use: Never used  Substance and Sexual Activity  . Alcohol use: Yes    Comment: occassionally  . Drug use: Not Currently  . Sexual activity: Not Currently    Birth control/protection: Post-menopausal  Other Topics Concern  . Not on file  Social History Narrative   Single.6 kids live with her.Unemployed .   Social Determinants of Health   Financial Resource Strain: Low Risk   . Difficulty of Paying Living Expenses: Not hard at all  Food Insecurity: No Food Insecurity  . Worried About Charity fundraiser in the Last Year: Never true  . Ran Out of Food in the Last Year: Never true  Transportation Needs: No Transportation Needs  .  Lack of Transportation (Medical): No  . Lack of Transportation (Non-Medical): No  Physical Activity: Inactive  . Days of Exercise per Week: 0 days  . Minutes of Exercise per Session: 0 min  Stress: No Stress Concern Present  . Feeling of Stress : Not at all  Social Connections: Unknown  . Frequency of Communication with Friends and Family: More than three times a week  . Frequency of Social Gatherings with Friends and Family: More than three times a week  . Attends Religious Services:  Patient refused  . Active Member of Clubs or Organizations: Yes  . Attends Archivist Meetings: Never  . Marital Status: Never married  Intimate Partner Violence: Not At Risk  . Fear of Current or Ex-Partner: No  . Emotionally Abused: No  . Physically Abused: No  . Sexually Abused: No    FAMILY HISTORY: Family History  Problem Relation Age of Onset  . Dementia Father   . Hypertension Father   . Kidney disease Mother   . Cancer Daughter        ovarian  . Lupus Daughter   . Hypertension Sister   . Hypertension Sister   . Hyperlipidemia Sister     ALLERGIES:  has No Known Allergies.  MEDICATIONS:  Current Outpatient Medications  Medication Sig Dispense Refill  . albuterol (VENTOLIN HFA) 108 (90 Base) MCG/ACT inhaler Inhale 2 puffs into the lungs every 6 (six) hours as needed for wheezing or shortness of breath. 8 g 2  . atorvastatin (LIPITOR) 20 MG tablet Take 1 tablet (20 mg total) by mouth every evening. 90 tablet 1  . benzonatate (TESSALON) 100 MG capsule Take 1 capsule (100 mg total) by mouth 2 (two) times daily as needed for cough. 20 capsule 0  . cholecalciferol (VITAMIN D3) 25 MCG (1000 UT) tablet Take 10,000 Units by mouth daily.    . famotidine (PEPCID) 20 MG tablet One after supper 30 tablet 11  . irbesartan (AVAPRO) 150 MG tablet Take 1 tablet (150 mg total) by mouth daily. 30 tablet 11  . levothyroxine (SYNTHROID) 125 MCG tablet Take 1 tablet (125 mcg total) by mouth daily before breakfast. 90 tablet 1  . metoprolol tartrate (LOPRESSOR) 25 MG tablet Take 0.5 tablets (12.5 mg total) by mouth 2 (two) times daily. 180 tablet 1  . nicotine (NICODERM CQ - DOSED IN MG/24 HOURS) 14 mg/24hr patch Place 1 patch (14 mg total) onto the skin daily. Step 2: place 1 patch to the skin daily for 2 weeks. Then change to the 7mg  patch. 14 patch 0  . nicotine (NICODERM CQ - DOSED IN MG/24 HOURS) 21 mg/24hr patch Place 1 patch (21 mg total) onto the skin daily. Step 1: place 1  patch to the skin daily for 6 weeks. Then change to the 14mg  patch. 42 patch 0  . nicotine (NICODERM CQ - DOSED IN MG/24 HR) 7 mg/24hr patch Place 1 patch (7 mg total) onto the skin daily. Step 3: place 1 patch to the skin daily for 2 weeks, then stop using the patch. 14 patch 1  . NIFEdipine (ADALAT CC) 90 MG 24 hr tablet Take 1 tablet (90 mg total) by mouth daily. 90 tablet 1   No current facility-administered medications for this visit.    PHYSICAL EXAMINATION:  ECOG PERFORMANCE STATUS: 0 - Asymptomatic  Vitals:   03/24/21 0850  BP: 112/76  Pulse: (!) 58  Resp: 18  Temp: 98.6 F (37 C)  SpO2: 99%   Filed  Weights   03/24/21 0850  Weight: 122 lb 9.6 oz (55.6 kg)   Physical Exam Constitutional:      Appearance: Normal appearance.  HENT:     Head: Normocephalic and atraumatic.  Cardiovascular:     Rate and Rhythm: Normal rate and regular rhythm.     Pulses: Normal pulses.     Heart sounds: Normal heart sounds.  Pulmonary:     Effort: Pulmonary effort is normal.     Breath sounds: Normal breath sounds. No wheezing or rales.  Abdominal:     General: Abdomen is flat. There is no distension.     Palpations: Abdomen is soft. There is no mass.     Tenderness: There is no abdominal tenderness.  Musculoskeletal:        General: Normal range of motion.     Cervical back: Normal range of motion and neck supple. No rigidity.  Lymphadenopathy:     Cervical: No cervical adenopathy.  Skin:    General: Skin is warm.  Neurological:     General: No focal deficit present.     Mental Status: She is alert.  Psychiatric:        Mood and Affect: Mood normal.      LABORATORY DATA:  I have reviewed the data as listed Lab Results  Component Value Date   WBC 6.7 03/10/2021   HGB 13.1 03/10/2021   HCT 39.8 03/10/2021   MCV 87.5 03/10/2021   PLT 409 (H) 03/10/2021     Chemistry      Component Value Date/Time   NA 137 03/10/2021 1230   K 4.1 03/10/2021 1230   CL 102  03/10/2021 1230   CO2 27 03/10/2021 1230   BUN 17 03/10/2021 1230   CREATININE 0.63 03/10/2021 1230   CREATININE 0.64 02/16/2021 0000      Component Value Date/Time   CALCIUM 9.6 03/10/2021 1230   ALKPHOS 83 03/10/2021 1230   AST 22 03/10/2021 1230   ALT 21 03/10/2021 1230   BILITOT 0.6 03/10/2021 1230     Labs reviewed Platelet count improved Epo level normal. No evidence of Iron def. JAK 2 panel pending.  RADIOGRAPHIC STUDIES: I have personally reviewed the radiological images as listed and agreed with the findings in the report. No results found.  All questions were answered. The patient knows to call the clinic with any problems, questions or concerns. I spent 15 minutes in the care of this patient including review of labs, H and P, counseling about thrombocytosis and causes, smoking cessation counseling     Benay Pike, MD 03/24/2021 9:26 AM

## 2021-03-26 LAB — CALR + JAK2 E12-15 + MPL (REFLEXED)

## 2021-03-26 LAB — JAK2 V617F, W REFLEX TO CALR/E12/MPL

## 2021-03-30 ENCOUNTER — Other Ambulatory Visit: Payer: Self-pay

## 2021-03-30 ENCOUNTER — Ambulatory Visit (INDEPENDENT_AMBULATORY_CARE_PROVIDER_SITE_OTHER): Payer: Medicaid Other | Admitting: Internal Medicine

## 2021-03-30 ENCOUNTER — Encounter (INDEPENDENT_AMBULATORY_CARE_PROVIDER_SITE_OTHER): Payer: Self-pay | Admitting: Internal Medicine

## 2021-03-30 VITALS — BP 118/70 | HR 64 | Temp 97.6°F | Resp 18 | Ht 63.0 in | Wt 122.0 lb

## 2021-03-30 DIAGNOSIS — E039 Hypothyroidism, unspecified: Secondary | ICD-10-CM | POA: Diagnosis not present

## 2021-03-30 DIAGNOSIS — E559 Vitamin D deficiency, unspecified: Secondary | ICD-10-CM | POA: Diagnosis not present

## 2021-03-30 DIAGNOSIS — I10 Essential (primary) hypertension: Secondary | ICD-10-CM | POA: Diagnosis not present

## 2021-03-30 NOTE — Progress Notes (Signed)
Metrics: Intervention Frequency ACO  Documented Smoking Status Yearly  Screened one or more times in 24 months  Cessation Counseling or  Active cessation medication Past 24 months  Past 24 months   Guideline developer: UpToDate (See UpToDate for funding source) Date Released: 2014       Wellness Office Visit  Subjective:  Patient ID: Samantha Valenzuela, female    DOB: Feb 25, 1956  Age: 65 y.o. MRN: [id number]  CC: This lady comes in for follow-up of hypertension, hyperlipidemia, hypothyroidism. HPI  Since the last time she saw Judson Roch, she has quit smoking cigarettes.  She is on nicotine patch.  This is helping her tremendously feel better.  She does not seem to have a cough or shortness of breath when she does not smoke cigarettes. She continues on levothyroxine at the present dose. She continues on statin therapy for hyperlipidemia. She continues on metoprolol and Avapro for her hypertension.  She also continues on nifedipine for this. Past Medical History:  Diagnosis Date  . Essential hypertension, benign 07/27/2019  . Gout   . HLD (hyperlipidemia) 07/27/2019  . Hypothyroidism, adult 07/27/2019  . Papanicolaou smear of cervix with positive high risk human papilloma virus (HPV) test   . Prediabetes 07/27/2019  . Vitamin D deficiency disease 07/27/2019   Past Surgical History:  Procedure Laterality Date  . COLONOSCOPY N/A 10/15/2019   Procedure: COLONOSCOPY;  Surgeon: Rogene Houston, MD;  [facility];  Service: Endoscopy;  Laterality: N/A;  830  . none    . POLYPECTOMY  10/15/2019   Procedure: POLYPECTOMY;  Surgeon: Rogene Houston, MD;  Location: [facility];  Service: Endoscopy;;   ascending colon     Family History  Problem Relation Age of Onset  . Dementia Father   . Hypertension Father   . Kidney disease Mother   . Cancer Daughter        ovarian  . Lupus Daughter   . Hypertension Sister   . Hypertension Sister   . Hyperlipidemia Sister     Social  History   Social History Narrative   Single.6 kids live with her.Unemployed .   Social History   Tobacco Use  . Smoking status: Current Every Day Smoker    Packs/day: 0.50    Years: 43.00    Pack years: 21.50    Types: Cigarettes  . Smokeless tobacco: Never Used  Substance Use Topics  . Alcohol use: Yes    Comment: occassionally    Current Meds  Medication Sig  . albuterol (VENTOLIN HFA) 108 (90 Base) MCG/ACT inhaler Inhale 2 puffs into the lungs every 6 (six) hours as needed for wheezing or shortness of breath.  Marland Kitchen atorvastatin (LIPITOR) 20 MG tablet Take 1 tablet (20 mg total) by mouth every evening.  . cholecalciferol (VITAMIN D3) 25 MCG (1000 UT) tablet Take 10,000 Units by mouth daily.  . famotidine (PEPCID) 20 MG tablet One after supper  . irbesartan (AVAPRO) 150 MG tablet Take 1 tablet (150 mg total) by mouth daily.  Marland Kitchen levothyroxine (SYNTHROID) 125 MCG tablet Take 1 tablet (125 mcg total) by mouth daily before breakfast.  . metoprolol tartrate (LOPRESSOR) 25 MG tablet Take 0.5 tablets (12.5 mg total) by mouth 2 (two) times daily.  . nicotine (NICODERM CQ - DOSED IN MG/24 HOURS) 14 mg/24hr patch Place 1 patch (14 mg total) onto the skin daily. Step 2: place 1 patch to the skin daily for 2 weeks. Then change to the 7mg  patch.  . nicotine (  NICODERM CQ - DOSED IN MG/24 HOURS) 21 mg/24hr patch Place 1 patch (21 mg total) onto the skin daily. Step 1: place 1 patch to the skin daily for 6 weeks. Then change to the 14mg  patch.  . nicotine (NICODERM CQ - DOSED IN MG/24 HR) 7 mg/24hr patch Place 1 patch (7 mg total) onto the skin daily. Step 3: place 1 patch to the skin daily for 2 weeks, then stop using the patch.  Marland Kitchen NIFEdipine (ADALAT CC) 90 MG 24 hr tablet Take 1 tablet (90 mg total) by mouth daily.     [place] Office Visit from 02/16/2021 in [facility]  PHQ-9 Total Score 0      Objective:   Today's Vitals: BP 118/70 (BP Location: Right Arm, Patient Position:  Sitting, Cuff Size: Normal)   Pulse 64   Temp 97.6 F (36.4 C) (Temporal)   Resp 18   Ht 5\' 3"  (1.6 m)   Wt 122 lb (55.3 kg)   SpO2 99%   BMI 21.61 kg/m  Vitals with BMI 03/30/2021 03/24/2021 03/14/2021  Height 5\' 3"  - 5\' 3"   Weight 122 lbs 122 lbs 10 oz 121 lbs 13 oz  BMI 21.62 91.91 66.06  Systolic 004 599 774  Diastolic 70 76 94  Pulse 64 58 81     Physical Exam  She looks systemically well.  Blood pressure is in very good range.  Her weight is stable.     Assessment   1. Essential hypertension, benign   2. Hypothyroidism, adult   3. Vitamin D deficiency disease       Tests ordered No orders of the defined types were placed in this encounter.    Plan: 1. Continue with all antihypertensive medications which are controlling her blood pressure well. 2. Continue with present dose of levothyroxine. 3. Follow-up with Judson Roch in about 4 months.   No orders of the defined types were placed in this encounter.   Doree Albee, MD

## 2021-04-03 ENCOUNTER — Other Ambulatory Visit: Payer: Self-pay

## 2021-04-03 ENCOUNTER — Other Ambulatory Visit (HOSPITAL_COMMUNITY)
Admission: RE | Admit: 2021-04-03 | Discharge: 2021-04-03 | Disposition: A | Discharge status: Home / Self Care | Payer: Medicaid Other | Source: Ambulatory Visit | Attending: Internal Medicine | Admitting: Internal Medicine

## 2021-04-04 ENCOUNTER — Other Ambulatory Visit (HOSPITAL_COMMUNITY)
Admission: RE | Admit: 2021-04-04 | Discharge: 2021-04-04 | Disposition: A | Discharge status: Home / Self Care | Payer: Medicaid Other | Source: Ambulatory Visit | Attending: Internal Medicine | Admitting: Internal Medicine

## 2021-04-04 DIAGNOSIS — Z20822 Contact with and (suspected) exposure to covid-19: Secondary | ICD-10-CM | POA: Diagnosis not present

## 2021-04-04 DIAGNOSIS — Z01812 Encounter for preprocedural laboratory examination: Secondary | ICD-10-CM | POA: Diagnosis not present

## 2021-04-04 LAB — SARS CORONAVIRUS 2 (TAT 6-24 HRS): SARS Coronavirus 2: NEGATIVE

## 2021-04-05 ENCOUNTER — Encounter: Payer: Self-pay | Admitting: *Deleted

## 2021-04-06 ENCOUNTER — Ambulatory Visit (HOSPITAL_COMMUNITY)
Admission: RE | Admit: 2021-04-06 | Discharge: 2021-04-06 | Disposition: A | Discharge status: Home / Self Care | Payer: Medicaid Other | Source: Ambulatory Visit | Attending: Internal Medicine | Admitting: Internal Medicine

## 2021-04-06 DIAGNOSIS — R058 Other specified cough: Secondary | ICD-10-CM | POA: Diagnosis present

## 2021-04-06 LAB — PULMONARY FUNCTION TEST
DL/VA % pred: 64 %
DL/VA: 2.72 ml/min/mmHg/L
DLCO unc % pred: 53 %
DLCO unc: 10.35 ml/min/mmHg
FEF 25-75 Post: 0.78 L/sec
FEF 25-75 Pre: 0.66 L/sec
FEF2575-%Change-Post: 16 %
FEF2575-%Pred-Post: 41 %
FEF2575-%Pred-Pre: 35 %
FEV1-%Change-Post: 3 %
FEV1-%Pred-Post: 71 %
FEV1-%Pred-Pre: 69 %
FEV1-Post: 1.36 L
FEV1-Pre: 1.31 L
FEV1FVC-%Change-Post: 0 %
FEV1FVC-%Pred-Pre: 80 %
FEV6-%Change-Post: 5 %
FEV6-%Pred-Post: 89 %
FEV6-%Pred-Pre: 84 %
FEV6-Post: 2.09 L
FEV6-Pre: 1.98 L
FEV6FVC-%Change-Post: 1 %
FEV6FVC-%Pred-Post: 101 %
FEV6FVC-%Pred-Pre: 99 %
FVC-%Change-Post: 3 %
FVC-%Pred-Post: 87 %
FVC-%Pred-Pre: 84 %
FVC-Post: 2.15 L
FVC-Pre: 2.07 L
Post FEV1/FVC ratio: 63 %
Post FEV6/FVC ratio: 97 %
Pre FEV1/FVC ratio: 64 %
Pre FEV6/FVC Ratio: 96 %
RV % pred: 139 %
RV: 2.81 L
TLC % pred: 107 %
TLC: 5.25 L

## 2021-04-06 MED ORDER — ALBUTEROL SULFATE (2.5 MG/3ML) 0.083% IN NEBU
2.5000 mg | INHALATION_SOLUTION | Freq: Once | RESPIRATORY_TRACT | Status: AC
  Administered 2021-04-06: 2.5 mg via RESPIRATORY_TRACT

## 2021-04-16 ENCOUNTER — Encounter: Payer: Self-pay | Admitting: Internal Medicine

## 2021-04-16 DIAGNOSIS — J449 Chronic obstructive pulmonary disease, unspecified: Secondary | ICD-10-CM | POA: Insufficient documentation

## 2021-04-17 NOTE — Progress Notes (Signed)
LMTCB

## 2021-04-20 NOTE — Progress Notes (Signed)
Called pt and left detailed msg on cell vm ok per DPR.

## 2021-07-27 ENCOUNTER — Ambulatory Visit (INDEPENDENT_AMBULATORY_CARE_PROVIDER_SITE_OTHER): Payer: Medicaid Other | Admitting: Nurse Practitioner

## 2021-08-16 ENCOUNTER — Telehealth: Payer: Self-pay

## 2021-08-16 NOTE — Telephone Encounter (Signed)
Transition Care Management Unsuccessful Follow-up Telephone Call  Date of discharge and from where:  08/15/2021-[facility]  Attempts:  1st Attempt  Reason for unsuccessful TCM follow-up call:  Left voice message

## 2021-08-17 NOTE — Telephone Encounter (Signed)
Transition Care Management Unsuccessful Follow-up Telephone Call  Date of discharge and from where:  08/15/2021 from [facility]  Attempts:  2nd Attempt  Reason for unsuccessful TCM follow-up call:  Left voice message

## 2021-08-18 NOTE — Telephone Encounter (Signed)
Transition Care Management Unsuccessful Follow-up Telephone Call  Date of discharge and from where:  08/15/2021 from [facility]  Attempts:  3rd Attempt  Reason for unsuccessful TCM follow-up call:  Unable to reach patient

## 2021-09-19 ENCOUNTER — Other Ambulatory Visit (HOSPITAL_COMMUNITY): Payer: Self-pay

## 2021-09-19 DIAGNOSIS — D75839 Thrombocytosis, unspecified: Secondary | ICD-10-CM

## 2021-09-20 ENCOUNTER — Inpatient Hospital Stay (HOSPITAL_COMMUNITY): Payer: Medicare Other | Attending: Hematology

## 2021-09-29 ENCOUNTER — Ambulatory Visit (HOSPITAL_COMMUNITY): Payer: Medicaid Other | Admitting: Physician Assistant

## 2021-10-02 ENCOUNTER — Telehealth: Payer: Self-pay

## 2021-10-02 NOTE — Telephone Encounter (Addendum)
Transition Care Management Unsuccessful Follow-up Telephone Call  Date of discharge and from where:  10/01/2021-[facility]  Attempts:  1st Attempt  Reason for unsuccessful TCM follow-up call:  Left voice message

## 2021-10-03 NOTE — Telephone Encounter (Signed)
Transition Care Management Unsuccessful Follow-up Telephone Call  Date of discharge and from where:  10/01/2021-[facility]  Attempts:  2nd Attempt  Reason for unsuccessful TCM follow-up call:  Left voice message

## 2021-10-04 NOTE — Telephone Encounter (Signed)
Transition Care Management Unsuccessful Follow-up Telephone Call  Date of discharge and from where:  10/01/2021 from [facility]  Attempts:  3rd Attempt  Reason for unsuccessful TCM follow-up call:  Unable to reach patient

## 2022-02-19 ENCOUNTER — Other Ambulatory Visit (INDEPENDENT_AMBULATORY_CARE_PROVIDER_SITE_OTHER): Payer: Self-pay | Admitting: Nurse Practitioner

## 2022-02-19 DIAGNOSIS — I10 Essential (primary) hypertension: Secondary | ICD-10-CM

## 2022-05-20 IMAGING — MG DIGITAL SCREENING BILAT W/ TOMO W/ CAD
8 series · 8 of 24 positions shown · non-contrast
Comparison: Previous exam(s).

CLINICAL DATA: Screening.

EXAM:
DIGITAL SCREENING BILATERAL MAMMOGRAM WITH TOMO AND CAD

[R CC synth-2D]
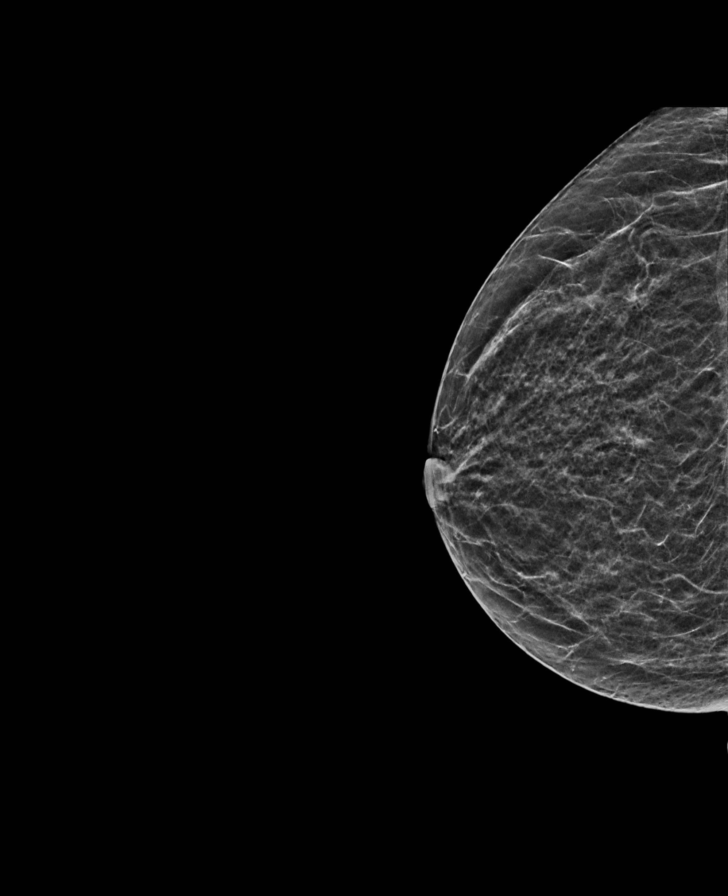

[R MLO synth-2D]
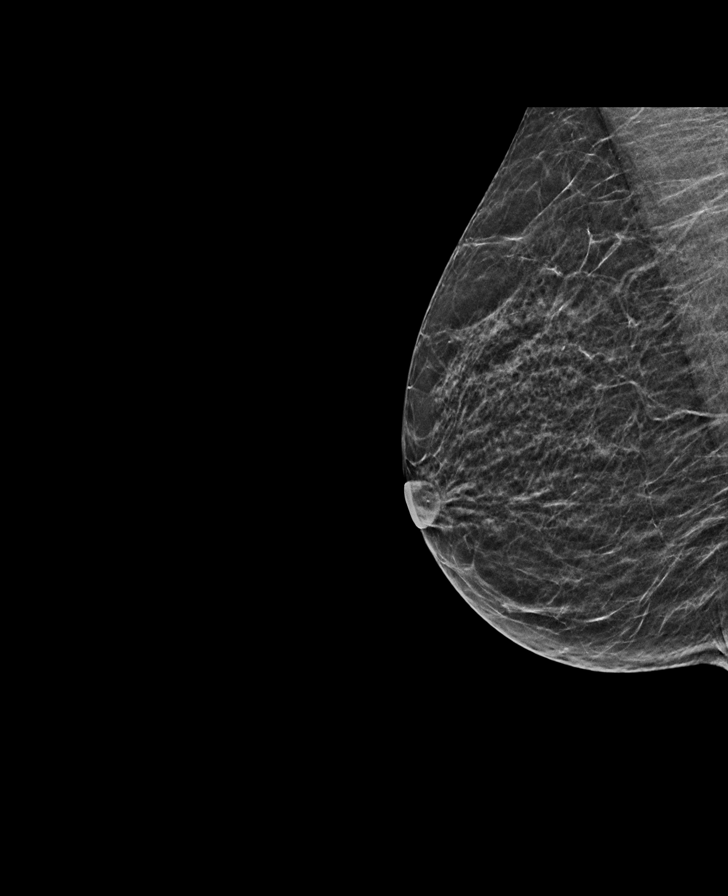

[L MLO synth-2D]
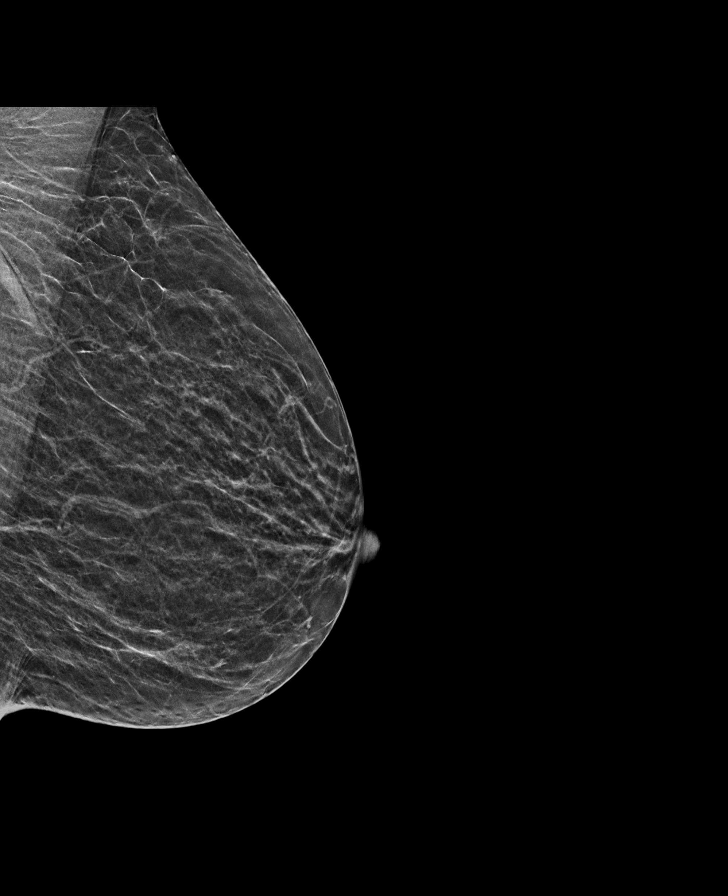

[L CC synth-2D]
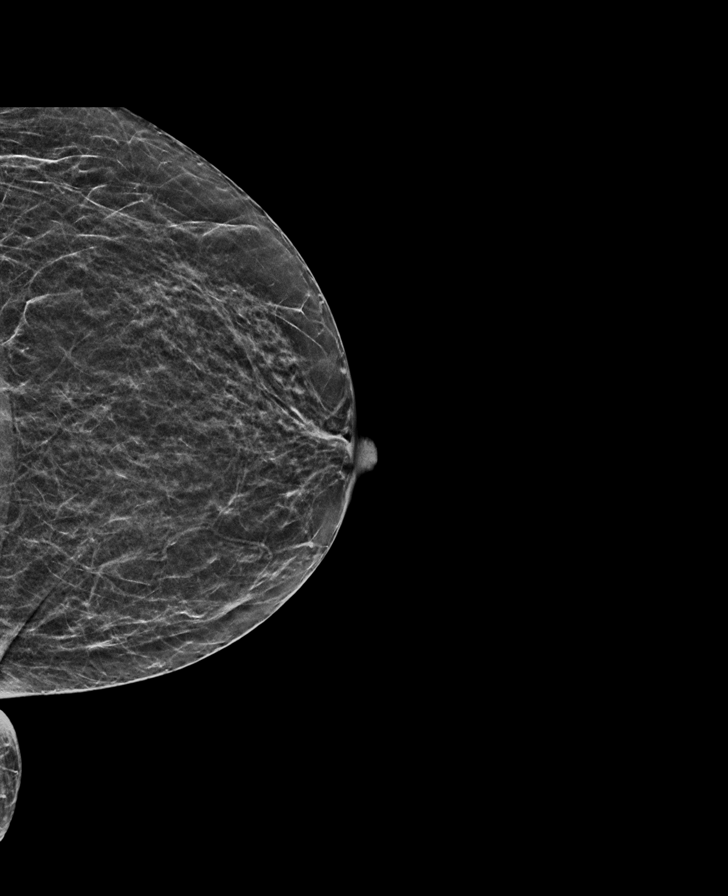

[R MLO tomo · tomo slice 20/39.0]
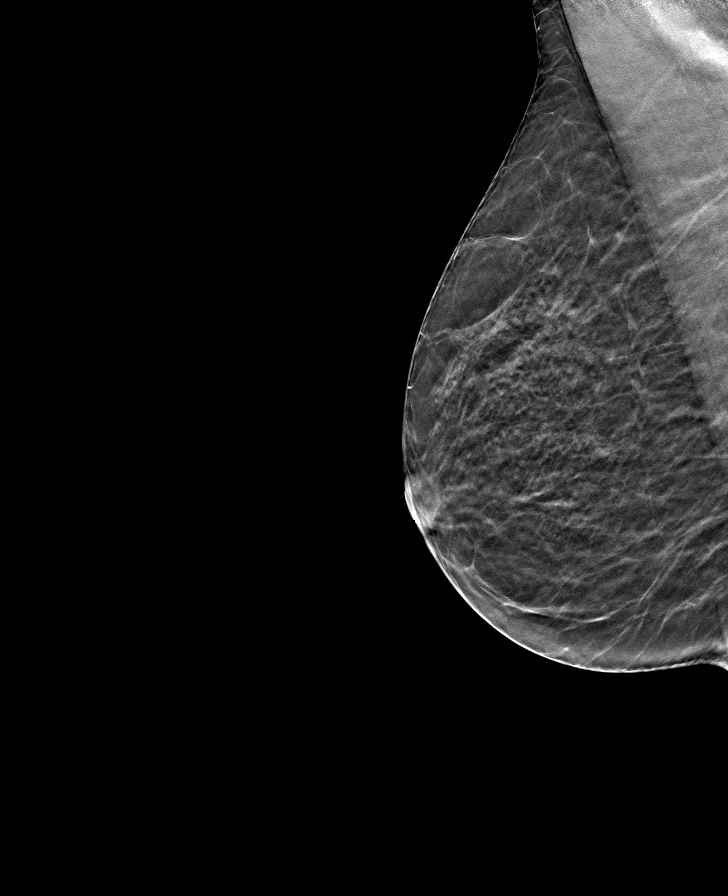

[L CC tomo · tomo slice 21/40.0]
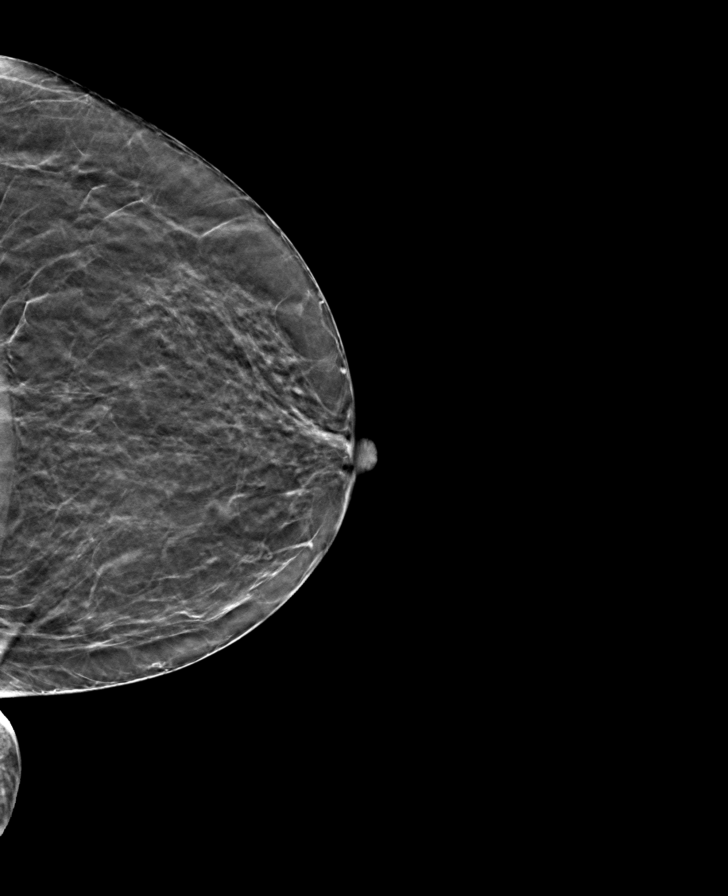

[R CC tomo · tomo slice 21/40.0]
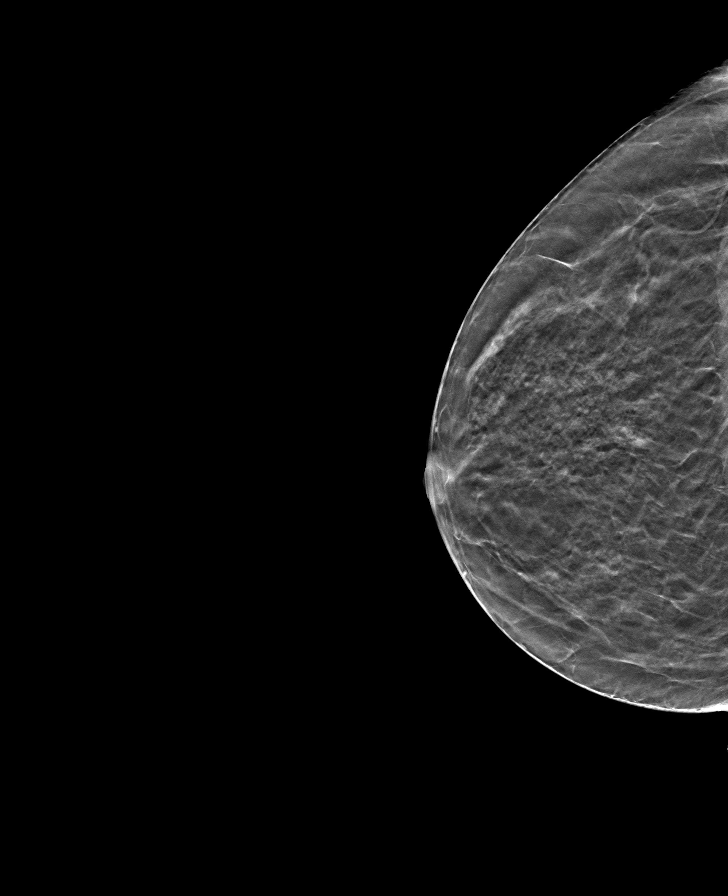

[L MLO tomo · tomo slice 21/41.0]
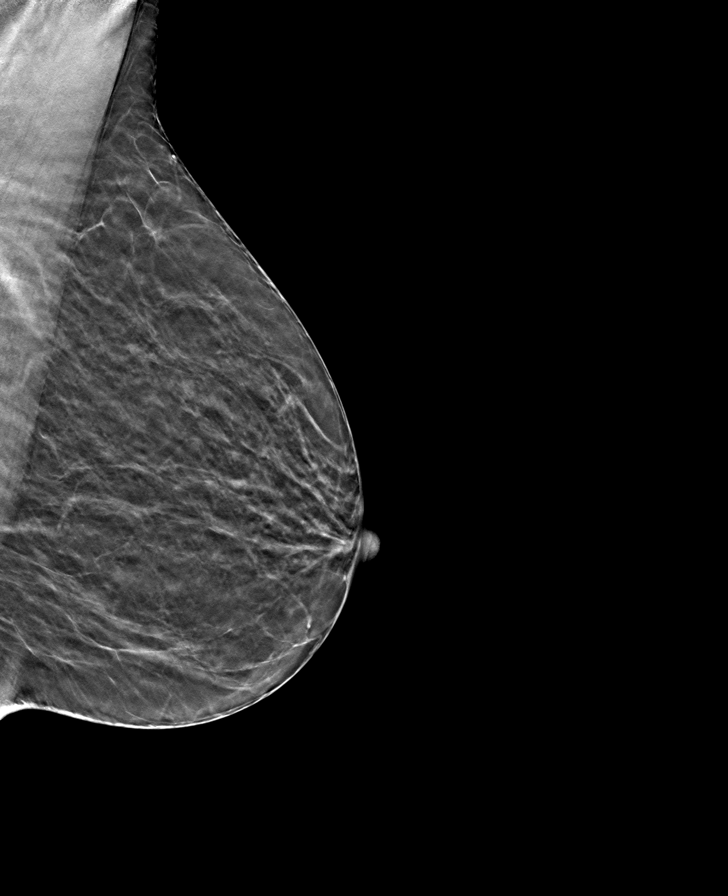

[8 of 24 positions shown; findings below may reference images not displayed]

ACR Breast Density Category b: There are scattered areas of
fibroglandular density.
FINDINGS: In the right breast, a possible asymmetry warrants further
evaluation. In the left breast, no findings suspicious for
malignancy. Images were processed with CAD.
IMPRESSION: Further evaluation is suggested for possible asymmetry in the right
breast.

RECOMMENDATION:
Diagnostic mammogram and possibly ultrasound of the right breast.
(Code:[id number]-55T)

The patient will be contacted regarding the findings, and additional
imaging will be scheduled.

BI-RADS CATEGORY  0: Incomplete. Need additional imaging evaluation
and/or prior mammograms for comparison.

## 2022-05-31 IMAGING — MG MM DIGITAL DIAGNOSTIC UNILAT*R* W/ TOMO W/ CAD
4 series · 4 of 12 positions shown · non-contrast
Comparison: Previous exam(s).

CLINICAL DATA: Possible asymmetry in the posterior central right
breast in the craniocaudal projection of a recent screening
mammogram.

EXAM:
DIGITAL DIAGNOSTIC UNILATERAL RIGHT MAMMOGRAM WITH TOMO AND CAD

[R ML synth-2D]
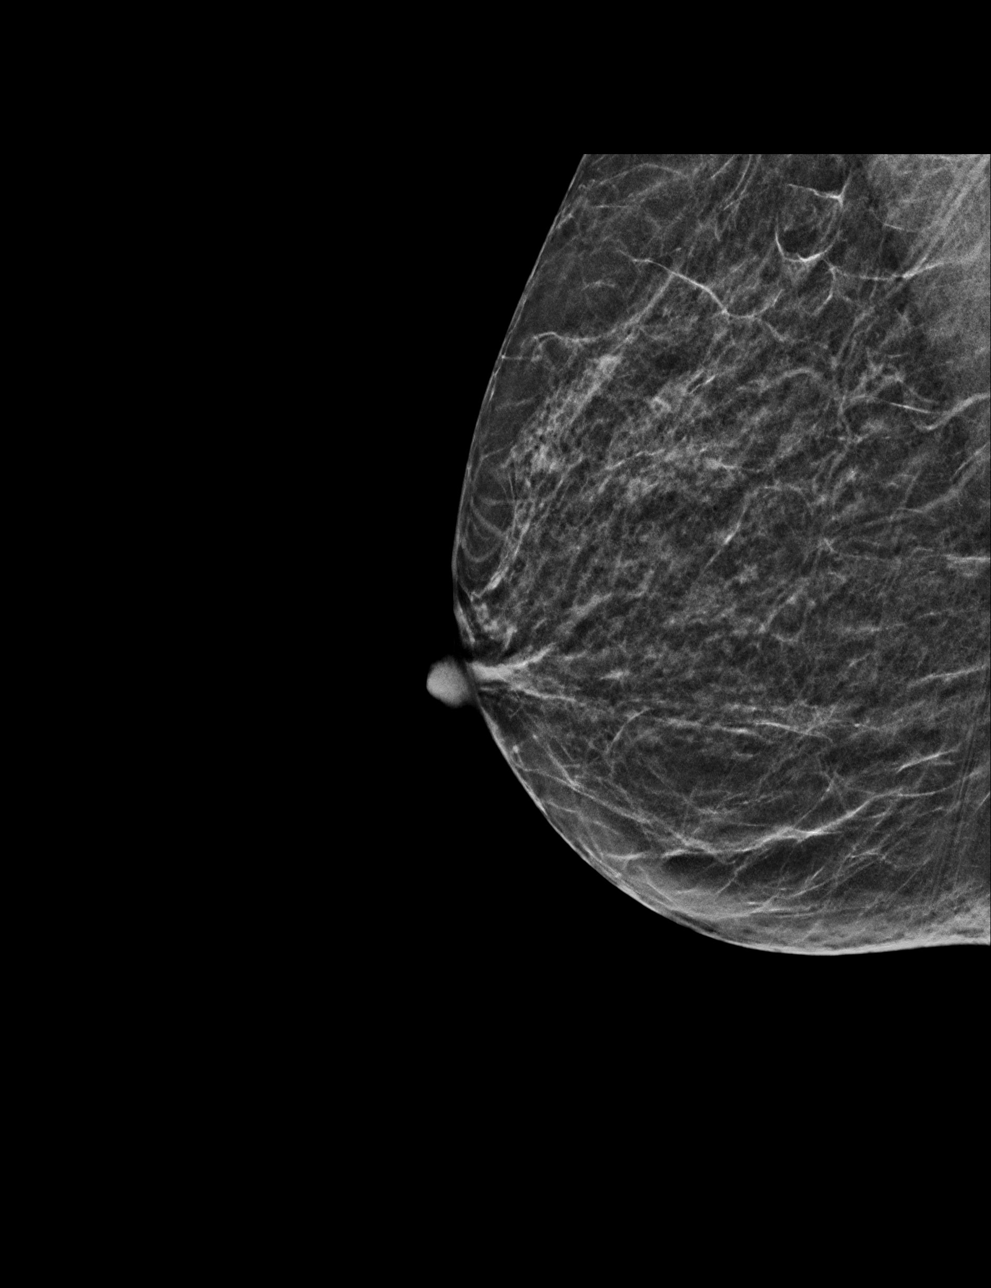

[R CC synth-2D]
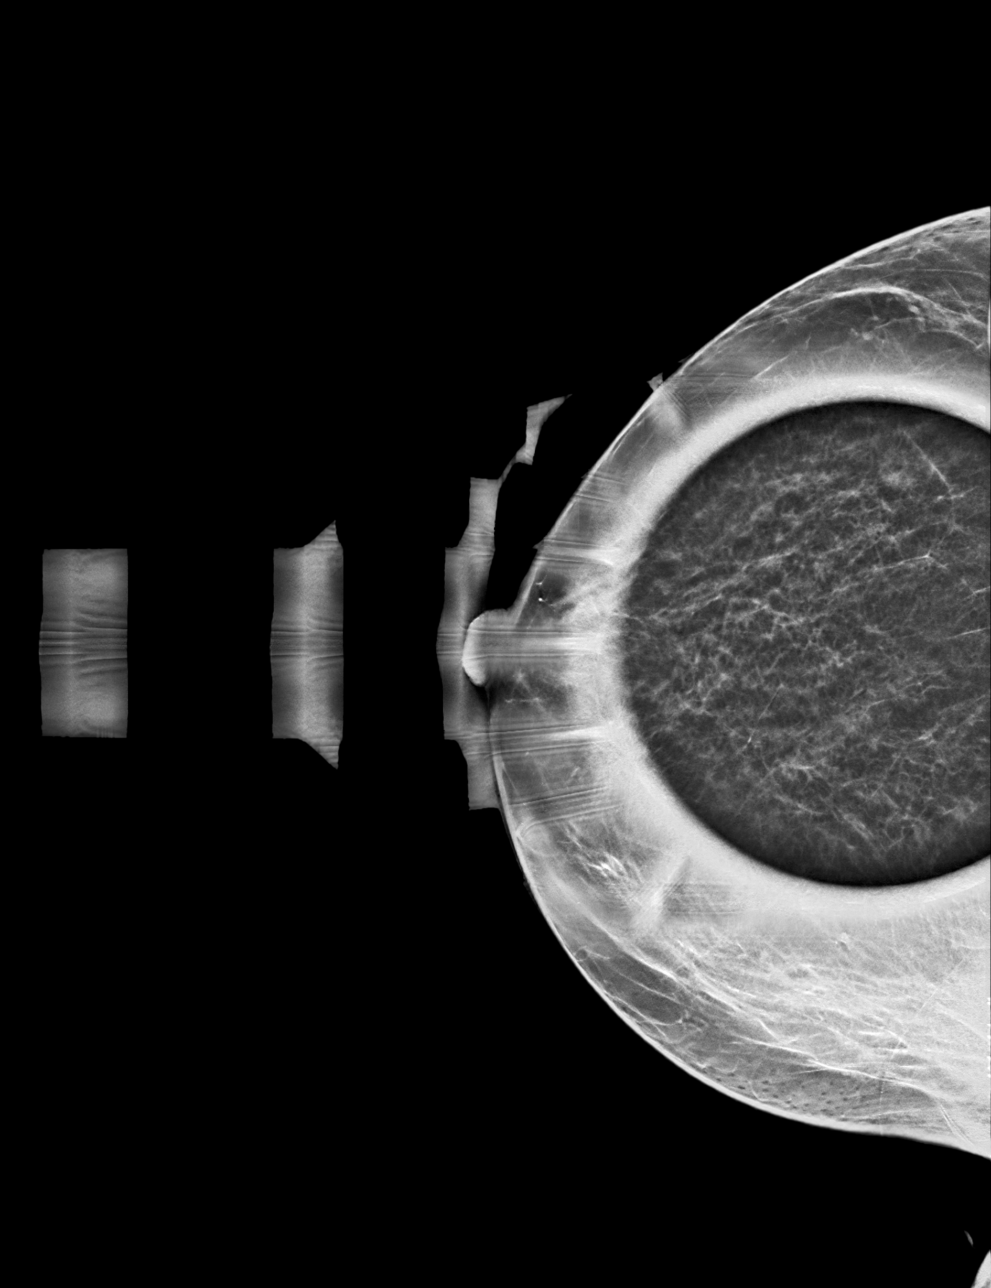

[R ML tomo · tomo slice 20/39.0]
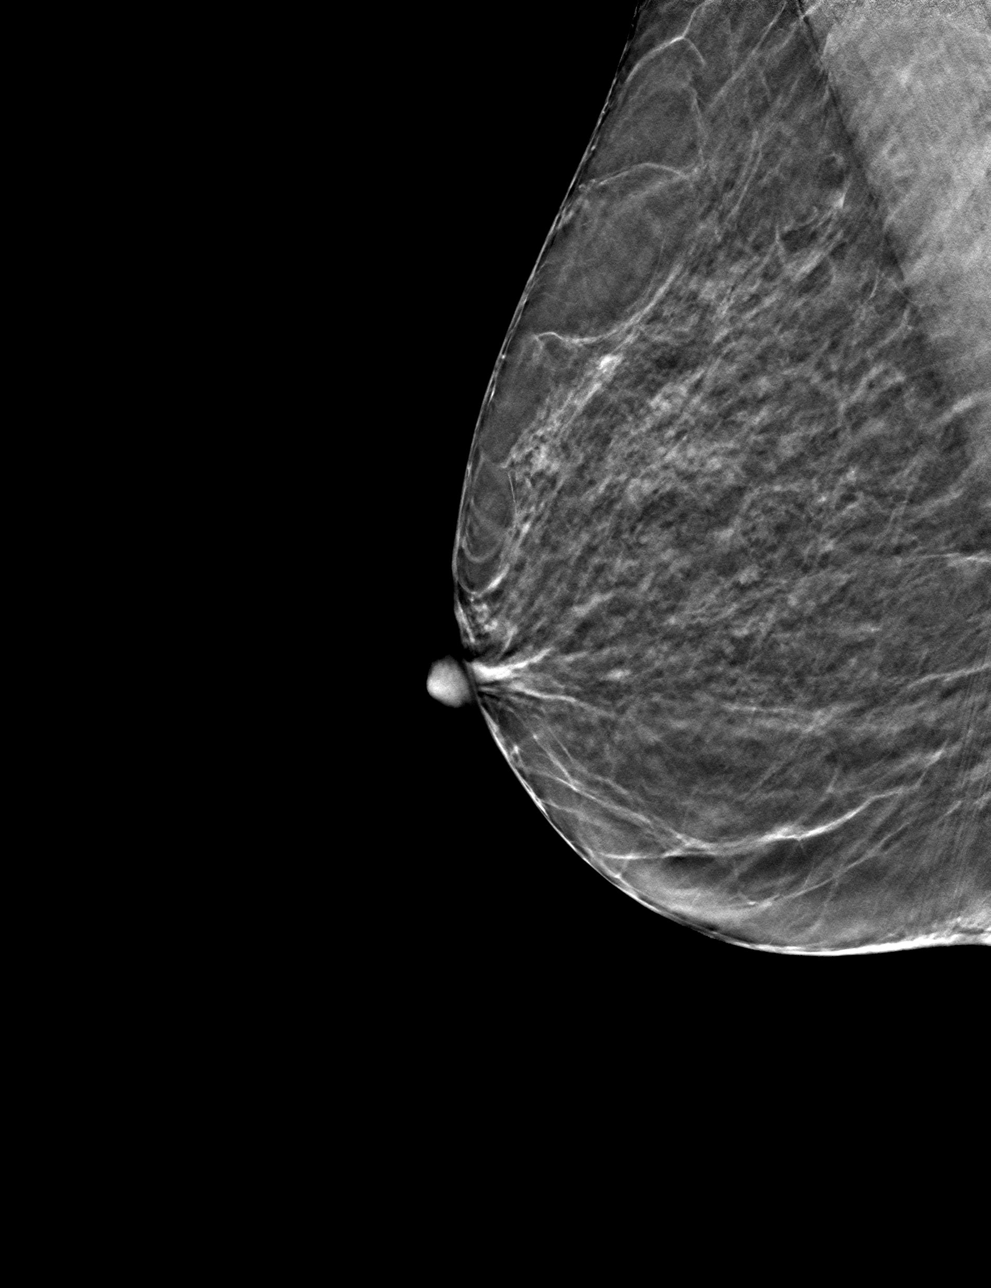

[R CC tomo · tomo slice 17/34.0]
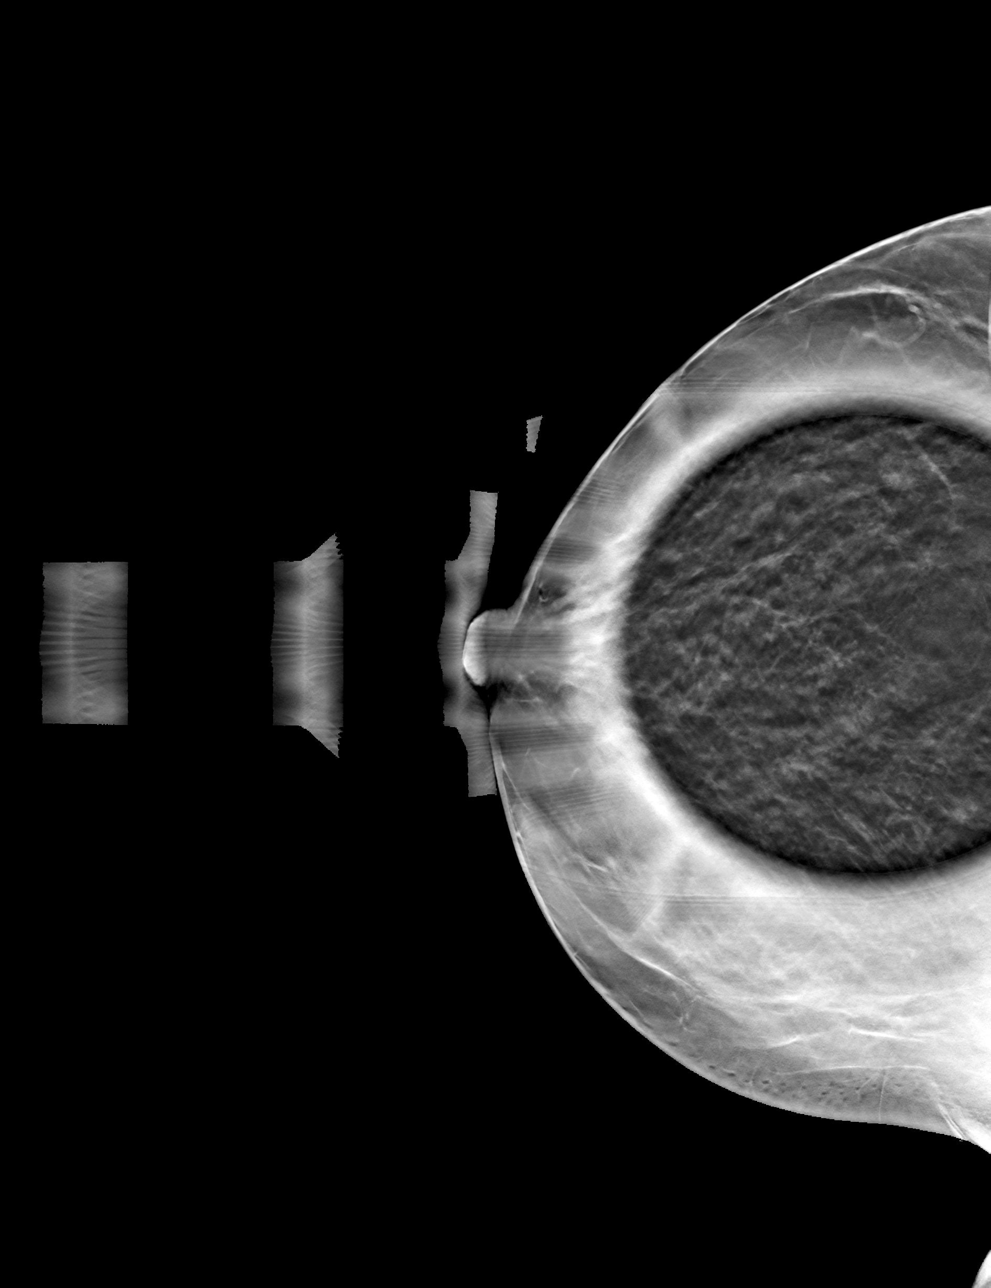

[4 of 12 positions shown; findings below may reference images not displayed]

ACR Breast Density Category b: There are scattered areas of
fibroglandular density.
FINDINGS: 3D tomographic and 2D generated true lateral and spot compression
craniocaudal images of the right breast were obtained. These
demonstrate normal appearing fibroglandular tissue at the location
of the recently suspected asymmetry, unchanged compared previous
examinations.

Mammographic images were processed with CAD.
IMPRESSION: No evidence of malignancy. The recently suspected right breast
asymmetry was close apposition of normal breast tissue.

RECOMMENDATION:
Bilateral screening mammogram in 1 year.

I have discussed the findings and recommendations with the patient.
If applicable, a reminder letter will be sent to the patient
regarding the next appointment.

BI-RADS CATEGORY  1: Negative.

## 2023-06-21 DIAGNOSIS — S0181XA Laceration without foreign body of other part of head, initial encounter: Secondary | ICD-10-CM | POA: Diagnosis not present

## 2023-06-21 DIAGNOSIS — Z7982 Long term (current) use of aspirin: Secondary | ICD-10-CM | POA: Diagnosis not present

## 2023-06-21 DIAGNOSIS — E785 Hyperlipidemia, unspecified: Secondary | ICD-10-CM | POA: Diagnosis not present

## 2023-06-21 DIAGNOSIS — W1839XA Other fall on same level, initial encounter: Secondary | ICD-10-CM | POA: Diagnosis not present

## 2023-06-21 DIAGNOSIS — S0990XA Unspecified injury of head, initial encounter: Secondary | ICD-10-CM | POA: Diagnosis not present

## 2023-06-21 DIAGNOSIS — I10 Essential (primary) hypertension: Secondary | ICD-10-CM | POA: Diagnosis not present

## 2023-06-21 DIAGNOSIS — E079 Disorder of thyroid, unspecified: Secondary | ICD-10-CM | POA: Diagnosis not present

## 2023-06-21 DIAGNOSIS — Z87891 Personal history of nicotine dependence: Secondary | ICD-10-CM | POA: Diagnosis not present

## 2023-06-21 DIAGNOSIS — S01112A Laceration without foreign body of left eyelid and periocular area, initial encounter: Secondary | ICD-10-CM | POA: Diagnosis not present

## 2023-06-24 ENCOUNTER — Telehealth: Payer: Self-pay

## 2023-06-24 NOTE — Telephone Encounter (Signed)
Transition Care Management Follow-up Telephone Call Date of discharge and from where: 06/21/2023 from [facility] ED How have you been since you were released from the hospital? good Any questions or concerns? No  Items Reviewed: Did the pt receive and understand the discharge instructions provided? Yes  Medications obtained and verified? Yes  Other? No  Any new allergies since your discharge? No  Dietary orders reviewed? No Do you have support at home? Yes   Home Care and Equipment/Supplies: Were home health services ordered? no If so, what is the name of the agency? N/a  Has the agency set up a time to come to the patient's home? not applicable Were any new equipment or medical supplies ordered?  No What is the name of the medical supply agency? N/a Were you able to get the supplies/equipment? not applicable Do you have any questions related to the use of the equipment or supplies? No  Functional Questionnaire: (I = Independent and D = Dependent) ADLs: I  Bathing/Dressing- I  Meal Prep- I  Eating- I  Maintaining continence- I  Transferring/Ambulation- I  Managing Meds- I  Follow up appointments reviewed:  PCP Hospital f/u appt confirmed? Yes  Scheduled to see Dr. Jonny Ruiz on 07/03/2023 @ 2:20 pm. Specialist Hospital f/u appt confirmed? No  Scheduled to see n/a on n/a @ n/a. Are transportation arrangements needed? No  If their condition worsens, is the pt aware to call PCP or go to the Emergency Dept.? Yes Was the patient provided with contact information for the PCP's office or ED? Yes Was to pt encouraged to call back with questions or concerns? Yes

## 2023-07-03 ENCOUNTER — Inpatient Hospital Stay: Payer: Medicare Other | Admitting: Internal Medicine

## 2023-07-04 ENCOUNTER — Encounter: Payer: Self-pay | Admitting: Nurse Practitioner

## 2024-06-15 ENCOUNTER — Ambulatory Visit: Admitting: Internal Medicine

## 2024-06-21 NOTE — Progress Notes (Unsigned)
 Samantha Valenzuela, female    DOB: 1955/11/11  MRN: [id number]   Brief patient profile:  68 yobf active smoker 1st seen 03/14/21 for cough x one year    History of Present Illness  03/14/2021  Pulmonary/ 1st office eval/ [name] / [facility] Office  Chief Complaint  Patient presents with   Pulmonary Consult    Referred by Dr. Lauraine Pereyra. Pt c/o DOE since had covid 19 approx a year ago. She sometimes gets SOB walking across the room and other days she does not. She also c/o dry cough.    Dyspnea:  Sometimes across the room, sometimes across the parking lot  Cough: dry / with gagging q night  Sleep: flat bed / 2 pillows  SABA use: not sure helps  Rec Only use your albuterol   (PROAIR ) as a rescue medication  Work on inhaler technique:  Stop lisinopril  and replace with avapro  150 mg (ibesartan) one daily in its place The key is to stop smoking completely before smoking completely stops you! Please schedule a follow up office visit in 6-8 weeks with pfts on return  >>> did not return as rec     April 07 2024  > [facility]  cva > rollator dep/R arm and leg worse.  06/22/2024  Re-Establish  ov/ office/[name] re: GOLD 2 copd doe/ cough  maint on nebs up to twice daily and sometime hs not sure helps and not sure what neb she's using /   still  smoking  Chief Complaint  Patient presents with   Establish Care    Cough - shob  Dyspnea:   op of driveway is struggle x 50 ft  Cough: non-productive /choking  with laughing / worse lie down  Sleeping: flat bed/ 2 pillows s    resp cc  SABA use: once or twice daily hfa ( < 25% )  02: none   Lung cancer screening: per PCP    No obvious day to day or daytime variability or assoc excess/ purulent sputum or mucus plugs or hemoptysis or cp or chest tightness, subjective wheeze or overt sinus or hb symptoms.    Also denies any obvious fluctuation of symptoms with weather or environmental changes or other aggravating or alleviating factors except as  outlined above   No unusual exposure hx or h/o childhood pna/ asthma or knowledge of premature birth.  Current Allergies, Complete Past Medical History, Past Surgical History, Family History, and Social History were reviewed in [employer] record.  ROS  The following are not active complaints unless bolded Hoarseness, sore throat, dysphagia, dental problems, itching, sneezing,  nasal congestion or discharge of excess mucus or purulent secretions, ear ache,   fever, chills, sweats, unintended wt loss or wt gain, classically pleuritic or exertional cp,  orthopnea pnd or arm/hand swelling  or leg swelling, presyncope, palpitations, abdominal pain, anorexia, nausea, vomiting, diarrhea  or change in bowel habits or change in bladder habits, change in stools or change in urine, dysuria, hematuria,  rash, arthralgias, visual complaints, headache, numbness, weakness or ataxia or problems with walking or coordination,  change in mood or  memory.        Current Meds  Medication Sig   albuterol  (VENTOLIN  HFA) 108 (90 Base) MCG/ACT inhaler Inhale 2 puffs into the lungs every 6 (six) hours as needed for wheezing or shortness of breath.   amLODipine (NORVASC) 5 MG tablet Take 5 mg by mouth daily.   atorvastatin  (LIPITOR) 20 MG tablet Take  1 tablet (20 mg total) by mouth every evening.   clopidogrel (PLAVIX) 75 MG tablet Take 75 mg by mouth once.   colchicine  0.6 MG tablet Take 0.6 mg by mouth 2 (two) times daily.   famotidine  (PEPCID ) 20 MG tablet One after supper   irbesartan  (AVAPRO ) 150 MG tablet Take 1 tablet (150 mg total) by mouth daily.   levothyroxine  (SYNTHROID ) 125 MCG tablet Take 1 tablet (125 mcg total) by mouth daily before breakfast.   losartan-hydrochlorothiazide (HYZAAR) 100-25 MG tablet Take 1 tablet by mouth daily.   pantoprazole  (PROTONIX ) 40 MG tablet Take 1 tablet (40 mg total) by mouth daily. Take 30-60 min before first meal of the day                    Past  Medical History:  Diagnosis Date   Essential hypertension, benign 07/27/2019   Gout    HLD (hyperlipidemia) 07/27/2019   Hypothyroidism, adult 07/27/2019   Papanicolaou smear of cervix with positive high risk human papilloma virus (HPV) test    Prediabetes 07/27/2019   Vitamin D  deficiency disease 07/27/2019      Objective:     Wt Readings from Last 3 Encounters:  06/22/24 127 lb (57.6 kg)  03/30/21 122 lb (55.3 kg)  03/24/21 122 lb 9.6 oz (55.6 kg)      Vital signs reviewed  06/22/2024  - Note at rest 02 sats  100% on RA   General appearance:    amb BF with rollator / Rhemiparesis   HEENT : Oropharynx  clear   Nasal turbinates nl    NECK :  without  apparent JVD/ palpable Nodes/TM    LUNGS: no acc muscle use,  Mild barrel  contour chest wall with bilateral  Distant bs s audible wheeze and  without cough on insp or exp maneuvers  and mild  Hyperresonant  to  percussion bilaterally     CV:  RRR  no s3 or murmur or increase in P2, and no edema   ABD:  soft and nontender with pos end  insp Hoover's  in the supine position.  No bruits or organomegaly appreciated   MS: ext warm without deformities Or obvious joint restrictions  calf tenderness, cyanosis or clubbing     SKIN: warm and dry without lesions    NEURO:  R arm/ leg weakness but amb on rollator       I personally reviewed  radiology impression as follows:  CXR:   pa and lateral 06/02/24  [facility] No acute cardiopulmonary  abn  Assessment     Assessment & Plan COPD   GOLD 2  Active smoker - PFT's 04/06/21   FEV1 1.36 (71 % ) ratio 0.63  p 3 % improvement from saba p 0 prior to study with DLCO  1.35 (53%) corrects to 2.72 (63%)  for alv volume and FV curve mild concavity   - 06/22/2024  After extensive coaching inhaler device,  effectiveness  < 25% so try just using the nebs and saba prn and max gerd rx /  1st gen H1 blockers per guidelines    Doing ok as more limited in terms of activity by her stroke than  doe or having aecopd so will work on her cough and continue present inhalers/ nebs but have her return with all meds in hand using a trust but verify approach to confirm accurate Medication  Reconciliation The principal here is that until we are certain that  the  patients are doing what we've asked, it makes no sense to ask them to do more.    Upper airway cough syndrome Onset spring 2021 with covid 19 assoc with variable sob - try off acei 03/14/2021 > improved  - 03/14/2021  After extensive coaching inhaler device,  effectiveness =  75% (delay between trigger in insp)   Upper airway cough syndrome (previously labeled PNDS),  is so named because it's frequently impossible to sort out how much is  CR/sinusitis with freq throat clearing (which can be related to primary GERD)   vs  causing  secondary ( extra esophageal)  GERD from wide swings in gastric pressure that occur with throat clearing, often  promoting self use of mint and menthol lozenges that reduce the lower esophageal sphincter tone and exacerbate the problem further in a cyclical fashion.   These are the same pts (now being labeled as having irritable larynx syndrome by some cough centers) who not infrequently have a history of having failed to tolerate ace inhibitors,  dry powder inhalers or biphosphonates or report having atypical/extraesophageal reflux symptoms(globus from LPR) that don't respond to standard doses of PPI  and are easily confused as having aecopd or asthma flares by even experienced allergists/ pulmonologists (myself included).   Rec: Max gerd rx and f/u 6 weeks  Cigarette smoker 4-5 min discussion re active cigarette smoking in addition to office E&M  Ask about tobacco use:   ongoing  Advise quitting   I took an extended  opportunity with this patient to outline the consequences of continued cigarette use  in airway disorders based on all the data we have from the multiple national lung health studies (perfomed  over decades at millions of dollars in cost)  indicating that smoking cessation, not choice of inhalers or pulmonary physicians, is the most important aspect of her care.   Assess willingness:  Not committed at this point Assist in quit attempt:  Per PCP when ready Arrange follow up:   Follow up per Primary Care planned     Low-dose CT lung cancer screening is recommended for patients who are 15-64 years of age with a 20+ pack-year history of smoking and who are currently smoking or quit <=15 years ago. No coughing up blood  No unintentional weight loss of > 15 pounds in the last 6 months - pt is eligible for scanning yearly until age 41 >  defer to PCP            Patient Instructions  The key is to stop smoking completely before smoking completely stops you!  Keep using your inhalers and nebulizers as needed for now but bring all of them  back with you   Pantoprazole  (protonix ) 40 mg   Take  30-60 min before first meal of the day and continue  Pepcid  (famotidine )  20 mg after supper until return to office - this is the best way to tell whether stomach acid is contributing to your problem.   For drainage / throat tickle try take CHLORPHENIRAMINE  4 mg  start with just a dose or two an hour before bedtime and see how you tolerate it before trying in daytime.     GERD (REFLUX)  is an extremely common cause of respiratory symptoms just like yours , many times with no obvious heartburn at all.    It can be treated with medication, but also with lifestyle changes including elevation of the head of your bed (ideally with  6 -8inch blocks under the headboard of your bed),  Smoking cessation, avoidance of late meals, excessive alcohol, and avoid fatty foods, chocolate, peppermint, colas, red wine, and acidic juices such as orange juice.  NO MINT OR MENTHOL PRODUCTS SO NO COUGH DROPS - LUDEN's (PECTIN)  USE SUGARLESS CANDY INSTEAD (Jolley ranchers or Stover's or Life Savers) or even ice chips  will also do - the key is to swallow to prevent all throat clearing. NO OIL BASED VITAMINS - use powdered substitutes.  Avoid fish oil when coughing.    Please schedule a follow up office visit in 6 weeks, call sooner if needed with all Respiratory  medications /inhalers/ solutions in hand so we can verify exactly what you are taking. This includes all medications from all doctors and over the counters.      Ozell America, MD 06/23/2024

## 2024-06-22 ENCOUNTER — Encounter: Payer: Self-pay | Admitting: Internal Medicine

## 2024-06-22 ENCOUNTER — Ambulatory Visit (INDEPENDENT_AMBULATORY_CARE_PROVIDER_SITE_OTHER): Admitting: Internal Medicine

## 2024-06-22 VITALS — BP 104/66 | HR 107 | Ht 63.0 in | Wt 127.0 lb

## 2024-06-22 DIAGNOSIS — J449 Chronic obstructive pulmonary disease, unspecified: Secondary | ICD-10-CM

## 2024-06-22 DIAGNOSIS — F17210 Nicotine dependence, cigarettes, uncomplicated: Secondary | ICD-10-CM | POA: Diagnosis not present

## 2024-06-22 DIAGNOSIS — R058 Other specified cough: Secondary | ICD-10-CM

## 2024-06-22 MED ORDER — PANTOPRAZOLE SODIUM 40 MG PO TBEC: 40.0000 mg | DELAYED_RELEASE_TABLET | Freq: Every day | ORAL | 2 refills | Status: DC

## 2024-06-22 NOTE — Patient Instructions (Addendum)
 The key is to stop smoking completely before smoking completely stops you!  Keep using your inhalers and nebulizers as needed for now but bring all of them  back with you   Pantoprazole  (protonix ) 40 mg   Take  30-60 min before first meal of the day and continue  Pepcid  (famotidine )  20 mg after supper until return to office - this is the best way to tell whether stomach acid is contributing to your problem.   For drainage / throat tickle try take CHLORPHENIRAMINE  4 mg  start with just a dose or two an hour before bedtime and see how you tolerate it before trying in daytime.     GERD (REFLUX)  is an extremely common cause of respiratory symptoms just like yours , many times with no obvious heartburn at all.    It can be treated with medication, but also with lifestyle changes including elevation of the head of your bed (ideally with 6 -8inch blocks under the headboard of your bed),  Smoking cessation, avoidance of late meals, excessive alcohol, and avoid fatty foods, chocolate, peppermint, colas, red wine, and acidic juices such as orange juice.  NO MINT OR MENTHOL PRODUCTS SO NO COUGH DROPS - LUDEN's (PECTIN)  USE SUGARLESS CANDY INSTEAD (Jolley ranchers or Stover's or Life Savers) or even ice chips will also do - the key is to swallow to prevent all throat clearing. NO OIL BASED VITAMINS - use powdered substitutes.  Avoid fish oil when coughing.    Please schedule a follow up office visit in 6 weeks, call sooner if needed with all Respiratory  medications /inhalers/ solutions in hand so we can verify exactly what you are taking. This includes all medications from all doctors and over the counters.

## 2024-06-23 ENCOUNTER — Encounter: Payer: Self-pay | Admitting: Internal Medicine

## 2024-06-23 DIAGNOSIS — F17210 Nicotine dependence, cigarettes, uncomplicated: Secondary | ICD-10-CM | POA: Insufficient documentation

## 2024-06-23 NOTE — Assessment & Plan Note (Addendum)
 Active smoker - PFT's 04/06/21   FEV1 1.36 (71 % ) ratio 0.63  p 3 % improvement from saba p 0 prior to study with DLCO  1.35 (53%) corrects to 2.72 (63%)  for alv volume and FV curve mild concavity   - 06/22/2024  After extensive coaching inhaler device,  effectiveness  < 25% so try just using the nebs and saba prn and max gerd rx /  1st gen H1 blockers per guidelines    Doing ok as more limited in terms of activity by her stroke than doe or having aecopd so will work on her cough and continue present inhalers/ nebs but have her return with all meds in hand using a trust but verify approach to confirm accurate Medication  Reconciliation The principal here is that until we are certain that the  patients are doing what we've asked, it makes no sense to ask them to do more.

## 2024-06-23 NOTE — Assessment & Plan Note (Addendum)
 Onset spring 2021 with covid 19 assoc with variable sob - try off acei 03/14/2021 > improved  - 03/14/2021  After extensive coaching inhaler device,  effectiveness =  75% (delay between trigger in insp)   Upper airway cough syndrome (previously labeled PNDS),  is so named because it's frequently impossible to sort out how much is  CR/sinusitis with freq throat clearing (which can be related to primary GERD)   vs  causing  secondary ( extra esophageal)  GERD from wide swings in gastric pressure that occur with throat clearing, often  promoting self use of mint and menthol lozenges that reduce the lower esophageal sphincter tone and exacerbate the problem further in a cyclical fashion.   These are the same pts (now being labeled as having irritable larynx syndrome by some cough centers) who not infrequently have a history of having failed to tolerate ace inhibitors,  dry powder inhalers or biphosphonates or report having atypical/extraesophageal reflux symptoms(globus from LPR) that don't respond to standard doses of PPI  and are easily confused as having aecopd or asthma flares by even experienced allergists/ pulmonologists (myself included).   Rec: Max gerd rx and f/u 6 weeks

## 2024-06-23 NOTE — Assessment & Plan Note (Addendum)
 4-5 min discussion re active cigarette smoking in addition to office E&M  Ask about tobacco use:   ongoing  Advise quitting   I took an extended  opportunity with this patient to outline the consequences of continued cigarette use  in airway disorders based on all the data we have from the multiple national lung health studies (perfomed over decades at millions of dollars in cost)  indicating that smoking cessation, not choice of inhalers or pulmonary physicians, is the most important aspect of her care.   Assess willingness:  Not committed at this point Assist in quit attempt:  Per PCP when ready Arrange follow up:   Follow up per Primary Care planned     Low-dose CT lung cancer screening is recommended for patients who are 68-4 years of age with a 20+ pack-year history of smoking and who are currently smoking or quit <=15 years ago. No coughing up blood  No unintentional weight loss of > 15 pounds in the last 6 months - pt is eligible for scanning yearly until age 44 >  defer to PCP

## 2024-08-03 ENCOUNTER — Ambulatory Visit: Admitting: Internal Medicine

## 2024-08-03 ENCOUNTER — Encounter: Payer: Self-pay | Admitting: Internal Medicine

## 2024-08-03 VITALS — BP 95/56 | HR 94 | Ht 63.0 in | Wt 125.0 lb

## 2024-08-03 DIAGNOSIS — R058 Other specified cough: Secondary | ICD-10-CM | POA: Diagnosis not present

## 2024-08-03 DIAGNOSIS — J449 Chronic obstructive pulmonary disease, unspecified: Secondary | ICD-10-CM | POA: Diagnosis not present

## 2024-08-03 DIAGNOSIS — F17210 Nicotine dependence, cigarettes, uncomplicated: Secondary | ICD-10-CM | POA: Diagnosis not present

## 2024-08-03 MED ORDER — AZITHROMYCIN 250 MG PO TABS: ORAL_TABLET | ORAL | 0 refills | Status: AC

## 2024-08-03 MED ORDER — PREDNISONE 10 MG PO TABS: ORAL_TABLET | ORAL | 0 refills | Status: AC

## 2024-08-03 NOTE — Patient Instructions (Addendum)
 Pantoprazole  (protonix ) 40 mg   Take  30-60 min before first meal of the day and Pepcid  (famotidine )  20 mg after supper until return to office - this is the best way to tell whether stomach acid is contributing to your problem.   Zpak  Prednisone 10 mg take  4 each am x 2 days,   2 each am x 2 days,  1 each am x 2 days and stop  For cough/ congestion >   mucinex dm  up to maximum of  1200 mg every 12 hours as needed  For drainage / throat tickle try take CHLORPHENIRAMINE  4 mg  ( may cause drowsiness so start with just a dose or two an hour before bedtime and see how you tolerate it before trying in daytime.    Please schedule a follow up visit in  6  months but call sooner if needed  with all medications /inhalers/ solutions in hand so we can verify exactly what you are taking. This includes all medications from all doctors and over the counters

## 2024-08-03 NOTE — Assessment & Plan Note (Addendum)
 Active smoker - PFT's 04/06/21   FEV1 1.36 (71 % ) ratio 0.63  p 3 % improvement from saba p 0 prior to study with DLCO  1.35 (53%) corrects to 2.72 (63%)  for alv volume and FV curve mild concavity   - 06/22/2024  After extensive coaching inhaler device,  effectiveness  < 25% so try just using the nebs and saba prn and max gerd rx     Mild acute on chronic bronchitis  >>> try zpak/ Prednisone 10 mg take  4 each am x 2 days,   2 each am x 2 days,  1 each am x 2 days and stop

## 2024-08-03 NOTE — Assessment & Plan Note (Addendum)
 Onset spring 2021 with covid 19 assoc with variable sob - try off acei 03/14/2021 > improved  - 03/14/2021  After extensive coaching inhaler device,  effectiveness =  75% (delay between trigger in insp)  - 08/03/2024  added 1st gen H1 blockers per guidelines

## 2024-08-03 NOTE — Progress Notes (Signed)
 Samantha Valenzuela, female    DOB: [birth year]  MRN: [id number]   Brief patient profile:  27 yobf active smoker 1st seen 03/14/21 for cough x one year    History of Present Illness  03/14/2021  Pulmonary/ 1st office eval/ Martino Tompson / [facility]  Chief Complaint  Patient presents with   Pulmonary Consult    Referred by Dr. Lauraine Pereyra. Pt c/o DOE since had covid 19 approx a year ago. She sometimes gets SOB walking across the room and other days she does not. She also c/o dry cough.    Dyspnea:  Sometimes across the room, sometimes across the parking lot  Cough: dry / with gagging q night  Sleep: flat bed / 2 pillows  SABA use: not sure helps  Rec Only use your albuterol   (PROAIR ) as a rescue medication  Work on inhaler technique:  Stop lisinopril  and replace with avapro  150 mg (ibesartan) one daily in its place The key is to stop smoking completely before smoking completely stops you! Please schedule a follow up office visit in 6-8 weeks with pfts on return  >>> did not return as rec     April 07 2024  > [facility]  cva > rollator dep/R arm and leg worse.  06/22/2024  Re-Establish  ov/[place] office/Malena Timpone re: GOLD 2 copd doe/ cough  maint on nebs up to twice daily and sometime hs not sure helps and not sure what neb she's using /   still  smoking  Chief Complaint  Patient presents with   Establish Care    Cough - shob  Dyspnea:   top of driveway is struggle x 50 ft  Cough: non-productive /choking  with laughing / worse lie down  Sleeping: flat bed/ 2 pillows s    resp cc  SABA use: once or twice daily hfa ( < 25% )  02: none  Lung cancer screening: per PCP  Rec The key is to stop smoking completely before smoking completely stops you! Keep using your inhalers and nebulizers as needed for now but bring all of them  back with you  Pantoprazole  (protonix ) 40 mg   Take  30-60 min before first meal of the day and continue  Pepcid  (famotidine )  20 mg after supper until return to office -For  drainage / throat tickle try take CHLORPHENIRAMINE  4 mg   GERD diet reviewed, bed blocks rec  Please schedule a follow up office visit in 6 weeks, call sooner if needed with all Respiratory  medications /inhalers/ solutions in hand      08/03/2024  f/u ov/[place] office/Milano Rosevear re: GOLD 2 copd maint on ? did not  bring meds / still smoking  Chief Complaint  Patient presents with   COPD    Coughing    Dyspnea:  no change  Cough: white  Sleeping: bed is flat with 2 pillows  s resp cc  SABA use: 1-2 x per day  02: none       No obvious day to day or daytime variability or assoc purulent sputum or mucus plugs or hemoptysis or cp or chest tightness, subjective wheeze or overt   hb symptoms.    Also denies any obvious fluctuation of symptoms with weather or environmental changes or other aggravating or alleviating factors except as outlined above   No unusual exposure hx or h/o childhood pna/ asthma or knowledge of premature birth.  Current Allergies, Complete Past Medical History, Past Surgical History, Family History, and Social  History were reviewed in [facility] electronic medical record.  ROS  The following are not active complaints unless bolded Hoarseness, sore throat, dysphagia, dental problems, itching, sneezing,  nasal congestion or discharge of excess mucus or purulent secretions, ear ache,   fever, chills, sweats, unintended wt loss or wt gain, classically pleuritic or exertional cp,  orthopnea pnd or arm/hand swelling  or leg swelling, presyncope, palpitations, abdominal pain, anorexia, nausea, vomiting, diarrhea  or change in bowel habits or change in bladder habits, change in stools or change in urine, dysuria, hematuria,  rash, arthralgias, visual complaints, headache, numbness, weakness or ataxia or problems with walking or coordination,  change in mood or  memory.          Past Medical History:  Diagnosis Date   Essential hypertension, benign 07/27/2019   Gout     HLD (hyperlipidemia) 07/27/2019   Hypothyroidism, adult 07/27/2019   Papanicolaou smear of cervix with positive high risk human papilloma virus (HPV) test    Prediabetes 07/27/2019   Vitamin D  deficiency disease 07/27/2019      Objective:    Wts   08/03/2024        125   06/22/24 127 lb (57.6 kg)  03/30/21 122 lb (55.3 kg)  03/24/21 122 lb 9.6 oz (55.6 kg)    Vital signs reviewed  08/03/2024  - Note at rest 02 sats 97 % on RA   General appearance:    amb bf  /uses cane/ wearing mask    Rhemiparesis  R ankle brace to prevent food dorp    HEENT : Oropharynx  masked   NECK :  without  apparent JVD/ palpable Nodes/TM    LUNGS: no acc muscle use,  Mild barrel  contour chest wall with bilateral  Distant scattered insp/exp rhonchi  and  without cough on insp or exp maneuvers  and mild  Hyperresonant  to  percussion bilaterally     CV:  RRR  no s3 or murmur or increase in P2, and no edema   ABD:  soft and nontender   MS:  Nl gait/ ext warm without deformities Or obvious joint restrictions  calf tenderness, cyanosis or clubbing / wearing R ankle brace    SKIN: warm and dry without lesions     Assessment   Assessment & Plan COPD   GOLD 2  Active smoker - PFT's 04/06/21   FEV1 1.36 (71 % ) ratio 0.63  p 3 % improvement from saba p 0 prior to study with DLCO  1.35 (53%) corrects to 2.72 (63%)  for alv volume and FV curve mild concavity   - 06/22/2024  After extensive coaching inhaler device,  effectiveness  < 25% so try just using the nebs and saba prn and max gerd rx     Mild acute on chronic bronchitis  >>> try zpak/ Prednisone 10 mg take  4 each am x 2 days,   2 each am x 2 days,  1 each am x 2 days and stop   Upper airway cough syndrome Onset spring 2021 with covid 19 assoc with variable sob - try off acei 03/14/2021 > improved  - 03/14/2021  After extensive coaching inhaler device,  effectiveness =  75% (delay between trigger in insp)  - 08/03/2024  added 1st gen H1 blockers  per guidelines   Cigarette smoker Counseled re importance of smoking cessation but did not meet time criteria for separate billing    Needs LDSCT if not part of  PCP annual studies / advised   Return in 6 m with all meds in hand using a trust but verify approach to confirm accurate Medication  Reconciliation The principal here is that until we are certain that the  patients are doing what we've asked, it makes no sense to ask them to do more.    Each maintenance medication was reviewed in detail including emphasizing most importantly the difference between maintenance and prns and under what circumstances the prns are to be triggered using an action plan format where appropriate.  Total time for H and P, chart review, counseling, reviewing hfa/ device(s) and generating customized AVS unique to this office visit / same day charting = 32 min            AVS  Patient Instructions  Pantoprazole  (protonix ) 40 mg   Take  30-60 min before first meal of the day and Pepcid  (famotidine )  20 mg after supper until return to office - this is the best way to tell whether stomach acid is contributing to your problem.   Zpak  Prednisone 10 mg take  4 each am x 2 days,   2 each am x 2 days,  1 each am x 2 days and stop  For cough/ congestion >   mucinex dm  up to maximum of  1200 mg every 12 hours as needed  For drainage / throat tickle try take CHLORPHENIRAMINE  4 mg  ( may cause drowsiness so start with just a dose or two an hour before bedtime and see how you tolerate it before trying in daytime.    Please schedule a follow up visit in  6  months but call sooner if needed  with all medications /inhalers/ solutions in hand so we can verify exactly what you are taking. This includes all medications from all doctors and over the counters           Ozell America, MD 08/03/2024

## 2024-08-03 NOTE — Assessment & Plan Note (Addendum)
 Counseled re importance of smoking cessation but did not meet time criteria for separate billing    Needs LDSCT if not part of PCP annual studies / advised   Return in 6 m with all meds in hand using a trust but verify approach to confirm accurate Medication  Reconciliation The principal here is that until we are certain that the  patients are doing what we've asked, it makes no sense to ask them to do more.    Each maintenance medication was reviewed in detail including emphasizing most importantly the difference between maintenance and prns and under what circumstances the prns are to be triggered using an action plan format where appropriate.  Total time for H and P, chart review, counseling, reviewing hfa/ device(s) and generating customized AVS unique to this office visit / same day charting = 32 min

## 2024-09-04 ENCOUNTER — Telehealth: Payer: Self-pay

## 2024-09-07 ENCOUNTER — Other Ambulatory Visit: Payer: Self-pay

## 2024-09-07 DIAGNOSIS — R058 Other specified cough: Secondary | ICD-10-CM

## 2024-09-07 MED ORDER — PANTOPRAZOLE SODIUM 40 MG PO TBEC: 40.0000 mg | DELAYED_RELEASE_TABLET | Freq: Every day | ORAL | 0 refills | Status: DC

## 2024-09-07 NOTE — Telephone Encounter (Signed)
 error

## 2024-09-16 ENCOUNTER — Encounter (INDEPENDENT_AMBULATORY_CARE_PROVIDER_SITE_OTHER): Payer: Self-pay | Admitting: *Deleted

## 2024-09-17 ENCOUNTER — Other Ambulatory Visit: Payer: Self-pay | Admitting: Internal Medicine

## 2024-09-17 DIAGNOSIS — R058 Other specified cough: Secondary | ICD-10-CM

## 2024-11-11 ENCOUNTER — Other Ambulatory Visit: Payer: Self-pay | Admitting: Internal Medicine

## 2024-11-11 DIAGNOSIS — R058 Other specified cough: Secondary | ICD-10-CM

## 2024-12-22 ENCOUNTER — Ambulatory Visit: Payer: Self-pay | Admitting: "Endocrinology
